# Patient Record
Sex: Female | Born: 1960 | Race: White | Hispanic: No | Marital: Married | Smoking: Never smoker
Health system: Southern US, Community
[De-identification: ages and names within clinical notes are randomized; demographics above are authoritative.]

## PROBLEM LIST (undated history)

## (undated) DIAGNOSIS — R0602 Shortness of breath: Secondary | ICD-10-CM

## (undated) DIAGNOSIS — R3915 Urgency of urination: Secondary | ICD-10-CM

## (undated) DIAGNOSIS — R569 Unspecified convulsions: Secondary | ICD-10-CM

## (undated) DIAGNOSIS — F410 Panic disorder [episodic paroxysmal anxiety] without agoraphobia: Secondary | ICD-10-CM

## (undated) DIAGNOSIS — G4700 Insomnia, unspecified: Secondary | ICD-10-CM

## (undated) DIAGNOSIS — E669 Obesity, unspecified: Secondary | ICD-10-CM

## (undated) DIAGNOSIS — F419 Anxiety disorder, unspecified: Secondary | ICD-10-CM

## (undated) DIAGNOSIS — Z9889 Other specified postprocedural states: Secondary | ICD-10-CM

## (undated) DIAGNOSIS — J45909 Unspecified asthma, uncomplicated: Secondary | ICD-10-CM

## (undated) DIAGNOSIS — I10 Essential (primary) hypertension: Secondary | ICD-10-CM

## (undated) DIAGNOSIS — S060XAA Concussion with loss of consciousness status unknown, initial encounter: Secondary | ICD-10-CM

## (undated) DIAGNOSIS — S060X9A Concussion with loss of consciousness of unspecified duration, initial encounter: Secondary | ICD-10-CM

## (undated) DIAGNOSIS — K219 Gastro-esophageal reflux disease without esophagitis: Secondary | ICD-10-CM

## (undated) DIAGNOSIS — L309 Dermatitis, unspecified: Secondary | ICD-10-CM

## (undated) DIAGNOSIS — D649 Anemia, unspecified: Secondary | ICD-10-CM

## (undated) DIAGNOSIS — Z87442 Personal history of urinary calculi: Secondary | ICD-10-CM

## (undated) DIAGNOSIS — R112 Nausea with vomiting, unspecified: Secondary | ICD-10-CM

## (undated) DIAGNOSIS — R350 Frequency of micturition: Secondary | ICD-10-CM

## (undated) DIAGNOSIS — R351 Nocturia: Secondary | ICD-10-CM

## (undated) DIAGNOSIS — M2550 Pain in unspecified joint: Secondary | ICD-10-CM

## (undated) HISTORY — PX: BREAST ENHANCEMENT SURGERY: SHX7

## (undated) HISTORY — PX: TUBAL LIGATION: SHX77

## (undated) HISTORY — PX: TOTAL ABDOMINAL HYSTERECTOMY W/ BILATERAL SALPINGOOPHORECTOMY: SHX83

## (undated) HISTORY — PX: OTHER SURGICAL HISTORY: SHX169

## (undated) HISTORY — DX: Anxiety disorder, unspecified: F41.9

## (undated) HISTORY — PX: ABDOMINAL HYSTERECTOMY: SHX81

## (undated) HISTORY — DX: Obesity, unspecified: E66.9

## (undated) HISTORY — PX: ORIF ANKLE FRACTURE: SUR919

---

## 2003-12-22 ENCOUNTER — Encounter: Admission: RE | Admit: 2003-12-22 | Discharge: 2004-03-21 | Payer: Self-pay | Admitting: Family Medicine

## 2004-07-07 ENCOUNTER — Emergency Department (HOSPITAL_COMMUNITY): Admission: EM | Admit: 2004-07-07 | Discharge: 2004-07-07 | Payer: Self-pay | Admitting: Emergency Medicine

## 2004-10-03 ENCOUNTER — Ambulatory Visit (HOSPITAL_BASED_OUTPATIENT_CLINIC_OR_DEPARTMENT_OTHER): Admission: RE | Admit: 2004-10-03 | Discharge: 2004-10-03 | Payer: Self-pay | Admitting: Family Medicine

## 2004-10-08 ENCOUNTER — Ambulatory Visit: Payer: Self-pay | Admitting: Internal Medicine

## 2008-12-15 ENCOUNTER — Emergency Department (HOSPITAL_COMMUNITY): Admission: EM | Admit: 2008-12-15 | Discharge: 2008-12-15 | Payer: Self-pay | Admitting: Emergency Medicine

## 2010-04-20 ENCOUNTER — Emergency Department (HOSPITAL_COMMUNITY)
Admission: EM | Admit: 2010-04-20 | Discharge: 2010-04-20 | Payer: Self-pay | Source: Home / Self Care | Admitting: Emergency Medicine

## 2010-07-25 LAB — DIFFERENTIAL
Basophils Absolute: 0.1 10*3/uL (ref 0.0–0.1)
Basophils Relative: 1 % (ref 0–1)
Eosinophils Absolute: 0.2 10*3/uL (ref 0.0–0.7)
Eosinophils Relative: 3 % (ref 0–5)
Monocytes Absolute: 0.4 10*3/uL (ref 0.1–1.0)
Neutro Abs: 5 10*3/uL (ref 1.7–7.7)

## 2010-07-25 LAB — COMPREHENSIVE METABOLIC PANEL
AST: 25 U/L (ref 0–37)
Albumin: 4 g/dL (ref 3.5–5.2)
BUN: 7 mg/dL (ref 6–23)
Calcium: 9.1 mg/dL (ref 8.4–10.5)
Creatinine, Ser: 0.73 mg/dL (ref 0.4–1.2)
GFR calc Af Amer: >60 mL/min (ref >60)

## 2010-07-25 LAB — CBC
HCT: 42.8 % (ref 36.0–46.0)
MCH: 27.6 pg (ref 26.0–34.0)
MCHC: 33.2 g/dL (ref 30.0–36.0)
MCV: 83.1 fL (ref 78.0–100.0)
RDW: 13.5 % (ref 11.5–15.5)

## 2010-07-25 LAB — POCT I-STAT, CHEM 8
Calcium, Ion: 1.05 mmol/L — ABNORMAL LOW (ref 1.12–1.32)
Glucose, Bld: 96 mg/dL (ref 70–99)
HCT: 46 % (ref 36.0–46.0)
Hemoglobin: 15.6 g/dL — ABNORMAL HIGH (ref 12.0–15.0)
TCO2: 25 mmol/L (ref 0–100)

## 2010-07-25 LAB — POCT CARDIAC MARKERS
CKMB, poc: <1 ng/mL — ABNORMAL LOW (ref 1.0–8.0)
Myoglobin, poc: 45.8 ng/mL (ref 12–200)

## 2010-08-19 LAB — RAPID URINE DRUG SCREEN, HOSP PERFORMED
Amphetamines: NOT DETECTED
Barbiturates: NOT DETECTED
Opiates: NOT DETECTED
Tetrahydrocannabinol: NOT DETECTED

## 2010-09-29 NOTE — Procedures (Signed)
NAME:  [name] [name], [name] [name]                 ACCOUNT NO.:  [id number]   MEDICAL RECORD NO.:  [id number]          PATIENT TYPE:  OUT   LOCATION:  SLEEP CENTER                 FACILITY:  [facility] - Closed   PHYSICIAN:  Clinton D. Maple Hudson, M.D. DATE OF BIRTH:  Jun 01, 1960   DATE OF STUDY:  10/03/2004                              NOCTURNAL POLYSOMNOGRAM   REFERRING PHYSICIAN:  Cheri Rous, M.D.   INDICATIONS FOR STUDY:  Hypersomnia with sleep apnea.   EPWORTH SLEEPINESS SCORE:  6/24, BMI 29, weight 165 pounds.   SLEEP ARCHITECTURE:  Total sleep time 446 minutes with sleep efficiency 92%.  Stage 1 was 7%; stage 2 was 82%; stages 3 and 4 were 3%, and REM was 7% of  total sleep time.  Sleep latency 5.5 minutes, REM latency 246 minutes.  Awake after sleep onset 34 minutes.  Arousal index increased at 77.  Klonopin and Ambien were listed, but apparently only Ativan 10 mg was taken  at bedtime.   RESPIRATORY DATA:  Respiratory disturbance index (RDI, AHI) 6.5 obstructive  events per hour indicating mild obstructive sleep apnea/hypopnea syndrome.  There were 2 central apneas, 38 obstructive apneas, and 8 hypopneas.  Events  were not position.  REM RDI 5.8.  She did not have enough events to qualify  for split study protocol on the study night.  There were many more EEG  arousals than were explained by respiratory events, most were spontaneous.   OXYGEN DATA:  Light to moderate snoring with oxygen desaturation to a nadir  of 76% with events.  Mean oxygen saturation through the study was 96% on  room air.   CARDIAC DATA:  Normal sinus rhythm.   MOVEMENT/PARASOMNIA:  A total of 48 limb jerks were recorded of which 13  were associated with arousal or awakening for a periodic limb movement with  arousal index of 1.7 per hour which is of doubtful significance.  Bathroom  trips x 5 were reported.  To gave history of bruxism.   IMPRESSION/RECOMMENDATION:  1.  Mild obstructive sleep apnea/hypopnea syndrome, RDI 6.5  per hour with      moderate snoring and oxygen desaturation to 76%.  2.  This is below the frequency of events usually considered for CPAP      therapy except in special circumstances.  Consider for alternative      therapies including weight loss and evaluation for nasopharyngeal      obstruction, allergic rhinitis, etc.  It may help to sleep off flat of      back even though most of these events were not clearly positional.  3.  Frequent bathroom trips contributed to sleep disruption.  4.  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      CDY/MEDQ  D:  10/08/2004 15:32:18  T:  10/08/2004 17:50:30  Job:  [id number]   cc:   Cheri Rous, M.D.

## 2011-05-15 HISTORY — PX: CARDIAC CATHETERIZATION: SHX172

## 2012-03-06 ENCOUNTER — Emergency Department (HOSPITAL_COMMUNITY): Payer: BC Managed Care – PPO

## 2012-03-06 ENCOUNTER — Encounter (HOSPITAL_COMMUNITY): Payer: Self-pay | Admitting: Emergency Medicine

## 2012-03-06 ENCOUNTER — Inpatient Hospital Stay (HOSPITAL_COMMUNITY)
Admission: EM | Admit: 2012-03-06 | Discharge: 2012-03-10 | DRG: 125 | Disposition: A | Discharge status: Home / Self Care | Payer: BC Managed Care – PPO | Attending: Internal Medicine | Admitting: Internal Medicine

## 2012-03-06 DIAGNOSIS — R071 Chest pain on breathing: Secondary | ICD-10-CM | POA: Diagnosis present

## 2012-03-06 DIAGNOSIS — Z8249 Family history of ischemic heart disease and other diseases of the circulatory system: Secondary | ICD-10-CM

## 2012-03-06 DIAGNOSIS — R072 Precordial pain: Principal | ICD-10-CM | POA: Diagnosis present

## 2012-03-06 DIAGNOSIS — R911 Solitary pulmonary nodule: Secondary | ICD-10-CM | POA: Diagnosis present

## 2012-03-06 DIAGNOSIS — Z79899 Other long term (current) drug therapy: Secondary | ICD-10-CM

## 2012-03-06 DIAGNOSIS — I517 Cardiomegaly: Secondary | ICD-10-CM | POA: Diagnosis present

## 2012-03-06 DIAGNOSIS — J45909 Unspecified asthma, uncomplicated: Secondary | ICD-10-CM | POA: Diagnosis present

## 2012-03-06 DIAGNOSIS — I200 Unstable angina: Secondary | ICD-10-CM

## 2012-03-06 DIAGNOSIS — G40909 Epilepsy, unspecified, not intractable, without status epilepticus: Secondary | ICD-10-CM | POA: Diagnosis present

## 2012-03-06 DIAGNOSIS — I10 Essential (primary) hypertension: Secondary | ICD-10-CM | POA: Diagnosis present

## 2012-03-06 HISTORY — DX: Essential (primary) hypertension: I10

## 2012-03-06 HISTORY — DX: Unspecified convulsions: R56.9

## 2012-03-06 HISTORY — DX: Unspecified asthma, uncomplicated: J45.909

## 2012-03-06 LAB — BASIC METABOLIC PANEL
BUN: 18 mg/dL (ref 6–23)
CO2: 27 mEq/L (ref 19–32)
GFR calc non Af Amer: >90 mL/min (ref >90)
Glucose, Bld: 113 mg/dL — ABNORMAL HIGH (ref 70–99)
Potassium: 3.7 mEq/L (ref 3.5–5.1)
Sodium: 140 mEq/L (ref 135–145)

## 2012-03-06 LAB — POCT I-STAT TROPONIN I: Troponin i, poc: 0 ng/mL (ref 0.00–0.08)

## 2012-03-06 LAB — D-DIMER, QUANTITATIVE: D-Dimer, Quant: 0.51 ug/mL-FEU — ABNORMAL HIGH (ref 0.00–0.48)

## 2012-03-06 LAB — CBC
HCT: 40.7 % (ref 36.0–46.0)
Hemoglobin: 13.9 g/dL (ref 12.0–15.0)
MCH: 28.3 pg (ref 26.0–34.0)
MCHC: 34.2 g/dL (ref 30.0–36.0)
RBC: 4.92 MIL/uL (ref 3.87–5.11)

## 2012-03-06 LAB — HEPATIC FUNCTION PANEL
ALT: 15 U/L (ref 0–35)
Albumin: 3.9 g/dL (ref 3.5–5.2)
Alkaline Phosphatase: 78 U/L (ref 39–117)
Total Bilirubin: 0.8 mg/dL (ref 0.3–1.2)
Total Protein: 7 g/dL (ref 6.0–8.3)

## 2012-03-06 MED ORDER — GI COCKTAIL ~~LOC~~: 30.0000 mL | Freq: Once | ORAL | Status: DC

## 2012-03-06 MED ORDER — NAPROXEN 500 MG PO TABS
500.0000 mg | ORAL_TABLET | Freq: Two times a day (BID) | ORAL | Status: DC
  Administered 2012-03-07 – 2012-03-09 (×5): 500 mg via ORAL
  Filled 2012-03-06 (×10): qty 1

## 2012-03-06 MED ORDER — DIPHENHYDRAMINE HCL 25 MG PO TABS
25.0000 mg | ORAL_TABLET | Freq: Four times a day (QID) | ORAL | Status: DC | PRN
  Administered 2012-03-06 – 2012-03-09 (×4): 25 mg via ORAL
  Filled 2012-03-06 (×4): qty 1

## 2012-03-06 MED ORDER — TRAMADOL HCL 50 MG PO TABS: 50.0000 mg | ORAL_TABLET | Freq: Four times a day (QID) | ORAL | Status: DC | PRN

## 2012-03-06 MED ORDER — KETOROLAC TROMETHAMINE 30 MG/ML IJ SOLN
30.0000 mg | Freq: Once | INTRAMUSCULAR | Status: AC
  Administered 2012-03-06: 30 mg via INTRAVENOUS
  Filled 2012-03-06: qty 1

## 2012-03-06 MED ORDER — ALBUTEROL SULFATE HFA 108 (90 BASE) MCG/ACT IN AERS
2.0000 | INHALATION_SPRAY | Freq: Four times a day (QID) | RESPIRATORY_TRACT | Status: DC | PRN
  Filled 2012-03-06: qty 6.7

## 2012-03-06 MED ORDER — ALPRAZOLAM 0.5 MG PO TABS
0.5000 mg | ORAL_TABLET | Freq: Three times a day (TID) | ORAL | Status: DC | PRN
  Administered 2012-03-06 – 2012-03-09 (×5): 0.5 mg via ORAL
  Filled 2012-03-06 (×5): qty 1

## 2012-03-06 MED ORDER — ATENOLOL 50 MG PO TABS
50.0000 mg | ORAL_TABLET | Freq: Every day | ORAL | Status: DC
  Filled 2012-03-06 (×2): qty 1

## 2012-03-06 MED ORDER — AMLODIPINE BESYLATE 10 MG PO TABS
10.0000 mg | ORAL_TABLET | Freq: Every day | ORAL | Status: DC
  Administered 2012-03-08 – 2012-03-10 (×3): 10 mg via ORAL
  Filled 2012-03-06 (×4): qty 1

## 2012-03-06 MED ORDER — PANTOPRAZOLE SODIUM 40 MG PO TBEC
40.0000 mg | DELAYED_RELEASE_TABLET | Freq: Every day | ORAL | Status: DC
  Administered 2012-03-07 – 2012-03-08 (×2): 40 mg via ORAL
  Filled 2012-03-06 (×2): qty 1

## 2012-03-06 MED ORDER — HEPARIN BOLUS VIA INFUSION: 4000.0000 [IU] | Freq: Once | INTRAVENOUS | Status: DC

## 2012-03-06 MED ORDER — ONDANSETRON HCL 4 MG/2ML IJ SOLN
4.0000 mg | Freq: Four times a day (QID) | INTRAMUSCULAR | Status: DC | PRN
  Administered 2012-03-06 – 2012-03-09 (×4): 4 mg via INTRAVENOUS
  Filled 2012-03-06 (×4): qty 2

## 2012-03-06 MED ORDER — ONDANSETRON HCL 4 MG/2ML IJ SOLN
4.0000 mg | Freq: Once | INTRAMUSCULAR | Status: AC
  Administered 2012-03-06: 4 mg via INTRAVENOUS
  Filled 2012-03-06: qty 2

## 2012-03-06 MED ORDER — ACETAMINOPHEN 500 MG PO TABS
1000.0000 mg | ORAL_TABLET | Freq: Four times a day (QID) | ORAL | Status: DC | PRN
  Administered 2012-03-06: 1000 mg via ORAL
  Filled 2012-03-06 (×2): qty 2

## 2012-03-06 MED ORDER — ALUM & MAG HYDROXIDE-SIMETH 200-200-20 MG/5ML PO SUSP
30.0000 mL | Freq: Once | ORAL | Status: AC
  Administered 2012-03-06: 30 mL via ORAL
  Filled 2012-03-06: qty 30

## 2012-03-06 MED ORDER — HEPARIN (PORCINE) IN NACL 100-0.45 UNIT/ML-% IJ SOLN
850.0000 [IU]/h | INTRAMUSCULAR | Status: DC
  Filled 2012-03-06: qty 250

## 2012-03-06 MED ORDER — ZOLPIDEM TARTRATE 5 MG PO TABS
10.0000 mg | ORAL_TABLET | Freq: Every evening | ORAL | Status: DC | PRN
  Administered 2012-03-06 – 2012-03-09 (×4): 10 mg via ORAL
  Filled 2012-03-06 (×2): qty 1
  Filled 2012-03-06 (×3): qty 2

## 2012-03-06 MED ORDER — ENOXAPARIN SODIUM 40 MG/0.4ML ~~LOC~~ SOLN
40.0000 mg | SUBCUTANEOUS | Status: DC
  Administered 2012-03-06 – 2012-03-09 (×4): 40 mg via SUBCUTANEOUS
  Filled 2012-03-06 (×5): qty 0.4

## 2012-03-06 MED ORDER — NITROGLYCERIN IN D5W 200-5 MCG/ML-% IV SOLN
2.0000 ug/min | INTRAVENOUS | Status: DC
  Administered 2012-03-06: 5 ug/min via INTRAVENOUS
  Filled 2012-03-06: qty 250

## 2012-03-06 NOTE — ED Notes (Signed)
Pt states around 3am she was awakened by a sharp pain in her sternum. Pt took 2 tylenol and laid back down and the pain eased off. She went to work this morning still had a nagging pain and she was sitting and suddenly the pain got worse. She states she gets relief from pressing on the area. Pt state " I feel like I have a broken sternum". N/V x1. 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 Pt was given 100 mcg fentanyl by EMS and reports pain a 8/10.

## 2012-03-06 NOTE — H&P (Signed)
Patient ID: Evalyne Cortopassi MRN: [id number], DOB/AGE: [birth year]   Admit date: 03/06/2012   Primary Physician: No primary provider on file. Primary Cardiologist: Dr Rennis Golden (new)  HPI: 51 y/o female with no cardiac history, presents to the ER today with complaints of SSCP. Her symptoms started 3 am, worse when she sits up. Her symptoms eased and she went on to work. At work her symptoms got worse. In the ER no relief with Mylanta or Toradol. She is still complaining of pain="like a golf ball under my sternum" No radiation to her arms, some worsening with deep breathing. No hemoptysis.   Problem List: Past Medical History  Diagnosis Date  . Hypertension   . Seizures     Past Surgical History  Procedure Date  . Orif ankle fracture 9 months ago    Rt ankle  . Abdominal hysterectomy   . Total abdominal hysterectomy w/ bilateral salpingoophorectomy      Allergies:  Allergies  Allergen Reactions  . Dilantin (Phenytoin Sodium Extended) Other (See Comments)    Reaction unknown  . Keppra (Levetiracetam) Other (See Comments)    Reaction unknown  . Phenobarbital Other (See Comments)    Reaction unknown  . Vimpat (Lacosamide) Other (See Comments)    Reaction unknown     Home Medications  (Not in a hospital admission)   Family History  Problem Relation Age of Onset  . Heart failure Mother   . Heart failure Father      History   Social History  . Marital Status: Married    Spouse Name: N/A    Number of Children: N/A  . Years of Education: N/A   Occupational History  . Not on file.   Social History Main Topics  . Smoking status: Never Smoker   . Smokeless tobacco: Not on file  . Alcohol Use: 0.6 oz/week    1 Glasses of wine per week     Occasionally  . Drug Use: No  . Sexually Active:    Other Topics Concern  . Not on file   Social History Narrative  . No narrative on file     Review of Systems: General: negative for chills, fever, night sweats or weight  changes.  Cardiovascular: negative for chest pain, dyspnea on exertion, edema, orthopnea, palpitations, paroxysmal nocturnal dyspnea or shortness of breath Dermatological: negative for rash Respiratory: negative for cough or wheezing Urologic: negative for hematuria Abdominal: negative for nausea, vomiting, diarrhea, bright red blood per rectum, melena, or hematemesis Neurologic: negative for visual changes, syncope, or dizziness All other systems reviewed and are otherwise negative except as noted above.  Physical Exam: Blood pressure 118/74, pulse 59, temperature 97.9 F (36.6 C), temperature source Oral, resp. rate 22, height 5\' 3"  (1.6 m), weight 84.823 kg (187 lb), SpO2 100.00%.  General appearance: alert, cooperative and mild distress Neck: no carotid bruit and no JVD Lungs: clear to auscultation bilaterally Heart: regular rate and rhythm Abdomen: soft, non-tender; bowel sounds normal; no masses,  no organomegaly Extremities: extremities normal, atraumatic, no cyanosis or edema Pulses: 2+ and symmetric Skin: Skin color, texture, turgor normal. No rashes or lesions Neurologic: Grossly normal    Labs:   Results for orders placed during the hospital encounter of 03/06/12 (from the past 24 hour(s))  CBC     Status: Normal   Collection Time   03/06/12  3:10 PM      Component Value Range   WBC 7.8  4.0 - 10.5 K/uL   RBC 4.92  3.87 - 5.11 MIL/uL   Hemoglobin 13.9  12.0 - 15.0 g/dL   HCT 16.1  09.6 - 04.5 %   MCV 82.7  78.0 - 100.0 fL   MCH 28.3  26.0 - 34.0 pg   MCHC 34.2  30.0 - 36.0 g/dL   RDW 40.9  81.1 - 91.4 %   Platelets 270  150 - 400 K/uL  BASIC METABOLIC PANEL     Status: Abnormal   Collection Time   03/06/12  3:10 PM      Component Value Range   Sodium 140  135 - 145 mEq/L   Potassium 3.7  3.5 - 5.1 mEq/L   Chloride 104  96 - 112 mEq/L   CO2 27  19 - 32 mEq/L   Glucose, Bld 113 (*) 70 - 99 mg/dL   BUN 18  6 - 23 mg/dL   Creatinine, Ser 7.82  0.50 - 1.10  mg/dL   Calcium 9.5  8.4 - 95.6 mg/dL   GFR calc non Af Amer >90  >90 mL/min   GFR calc Af Amer >90  >90 mL/min  POCT I-STAT TROPONIN I     Status: Normal   Collection Time   03/06/12  3:18 PM      Component Value Range   Troponin i, poc 0.00  0.00 - 0.08 ng/mL   Comment 3           LIPASE, BLOOD     Status: Abnormal   Collection Time   03/06/12  3:25 PM      Component Value Range   Lipase 79 (*) 11 - 59 U/L  HEPATIC FUNCTION PANEL     Status: Normal   Collection Time   03/06/12  3:25 PM      Component Value Range   Total Protein 7.0  6.0 - 8.3 g/dL   Albumin 3.9  3.5 - 5.2 g/dL   AST 14  0 - 37 U/L   ALT 15  0 - 35 U/L   Alkaline Phosphatase 78  39 - 117 U/L   Total Bilirubin 0.8  0.3 - 1.2 mg/dL   Bilirubin, Direct 0.1  0.0 - 0.3 mg/dL   Indirect Bilirubin 0.7  0.3 - 0.9 mg/dL     Radiology/Studies: Dg Chest Port 1 View  03/06/2012  *RADIOLOGY REPORT*  Clinical Data: Chest pain  PORTABLE CHEST - 1 VIEW  Comparison: 05/30/2010  Findings: Mild cardiomegaly.  Mild subsegmental bibasilar atelectasis.  No pneumothorax and no pleural effusion. Normal vascularity.  IMPRESSION: Bibasilar atelectasis.  Cardiomegaly.   Original Report Authenticated By: Donavan Burnet, M.D.     EKG:NSR SB without acute changes  ASSESSMENT AND PLAN:  Principal Problem:  *Chest pain on breathing Active Problems:  HTN (hypertension)  Family history of coronary artery disease  Plan- R/O MI, add PPI, NSAID, NTG. Check d-dimer, she has had some chronic Rt leg edema after her surgery 9 months ago.  Deland Pretty, PA-C 03/06/2012, 5:14 PM

## 2012-03-06 NOTE — H&P (Signed)
Pt. Seen and examined. Agree with the NP/PA-C note as written.  51 yo female with a history of chest pain about 2 years ago which was similar. She ruled-out for MI at that time in [place]. 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 It does not radiate and is not relieved by rest or provoked with activity. She received 2 nitroglycerins in the ER which helped the pain as well as a GI cocktail and toradol which were ineffective. EKG shows NSR without ischemia. POC troponin is negative. CXR does show cardiomegaly, but otherwise negative. Will r/o ACS overnight. Start nitroglycerin for possible ACS, however, I suspect this may be esophageal spasm.  Would be reasonable to obtain an echocardiogram in the morning given her cardiomegaly to r/o pericardial effusion. Her pain is worse while sitting up and a little better lying down, which is opposite of what I would expect. Will also check d-dimer for right swollen leg, although she reports this is chronic since her recent ORIF.  Chrystie Nose, MD, [facility] Attending Cardiologist [facility]

## 2012-03-06 NOTE — ED Provider Notes (Addendum)
I have supervised the resident on the management of this patient and agree with the note above. I personally interviewed and examined the patient and my addendum is below.   [name] [name] is a 51 y.o. female hx of HTN here with chest pain. Sudden onset CP last night and another episode today. Improved slightly with nitro. No SOB. On my exam, patient was clenching her chest. EKG nl, Trop neg x 1. No CAD in the past. Patient's pain not improved with GI cocktail and toradol. She was started on nitro drip and heparin drip for possible unstable angina. Patient admitted to cardiology under stepdown given active chest pain.   CRITICAL CARE Performed by: Silverio Lay, Taran Haynesworth   Total critical care time: 30 minutes  Critical care time was exclusive of separately billable procedures and treating other patients.  Critical care was necessary to treat or prevent imminent or life-threatening deterioration.  Critical care was time spent personally by me on the following activities: development of treatment plan with patient and/or surrogate as well as nursing, discussions with consultants, evaluation of patient's response to treatment, examination of patient, obtaining history from patient or surrogate, ordering and performing treatments and interventions, ordering and review of laboratory studies, ordering and review of radiographic studies, pulse oximetry and re-evaluation of patient's condition.   Richardean Canal, MD 03/06/12 1651  Richardean Canal, MD 03/07/12 [phone]

## 2012-03-06 NOTE — ED Provider Notes (Signed)
History     CSN: [id number]  Arrival date & time 03/06/12  1348   None     Chief Complaint  Patient presents with  . Chest Pain    (Consider location/radiation/quality/duration/timing/severity/associated sxs/prior treatment) Patient is a 51 y.o. female presenting with chest pain.  Chest Pain   Chest pain.  Pain described as sharp, 7/10 in intensity. The location of the patient's problem is central chest with no radiation.  Onset was sudden at 3 AM with waxing and waning course since that time. 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  Associated symptoms: worse with breathing, worse with movement. Emesis x1.   Past Medical History  Diagnosis Date  . Hypertension   . Seizures     Past Surgical History  Procedure Date  . Orif ankle fracture     Rt ankle  . Abdominal hysterectomy   . Total abdominal hysterectomy w/ bilateral salpingoophorectomy     Family History  Problem Relation Age of Onset  . Heart failure Mother   . Heart failure Father     History  Substance Use Topics  . Smoking status: Never Smoker   . Smokeless tobacco: Not on file  . Alcohol Use: 0.6 oz/week    1 Glasses of wine per week     Occasionally    OB History    Grav Para Term Preterm Abortions TAB SAB Ect Mult Living                  Review of Systems  Cardiovascular: Positive for chest pain.  Negative for respiratory distress, cough. Positive for vomiting, negative for diarrhea. LMP 3 years ago prior to hysterectomy.  No change in stool color, rectal bleeding.   All other systems reviewed and negative unless noted in HPI.     Allergies  Dilantin; Keppra; Phenobarbital; and Vimpat  Home Medications   Current Outpatient Rx  Name Route Sig Dispense Refill  . ACETAMINOPHEN 500 MG PO TABS Oral Take 1,000 mg by mouth every 6  (six) hours as needed. For pain    . ALBUTEROL SULFATE HFA 108 (90 BASE) MCG/ACT IN AERS Inhalation Inhale 2 puffs into the lungs every 6 (six) hours as needed. For shortness of breath    . ALPRAZOLAM 0.5 MG PO TABS Oral Take 0.5 mg by mouth 3 (three) times daily as needed. For anxiety    . AMLODIPINE BESYLATE 10 MG PO TABS Oral Take 10 mg by mouth daily.    . ATENOLOL 50 MG PO TABS Oral Take 50 mg by mouth daily.    [name] DIPHENHYDRAMINE HCL 25 MG PO TABS Oral Take 25 mg by mouth every 6 (six) hours as needed. For sleep    . KRILL OIL PO Oral Take 1 capsule by mouth daily.      BP 118/74  Pulse 59  Temp 97.9 F (36.6 C) (Oral)  Resp 22  SpO2 100%  Physical Exam Nursing note and vitals reviewed.  Constitutional: Pt is alert and appears stated age. Oropharynx: Airway open without erythema or exudate. Respiratory: No respiratory distress. Equal breathing bilaterally. CV: Extremities warm and well perfused. Neuro: No motor nor sensory deficit. Head: Normocephalic and atraumatic. Eyes: No conjunctivitis, no scleral icterus. Neck: Supple, no mass. Chest: Tender to palpation. Abdomen: Soft, non-tender MSK: Extremities are atraumatic without deformity.  Skin: No rash, no wounds.  ED Course  Procedures (including critical care time)  Labs Reviewed  BASIC METABOLIC PANEL - Abnormal; Notable for the following:    Glucose, Bld 113 (*)     All other components within normal limits  LIPASE, BLOOD - Abnormal; Notable for the following:    Lipase 79 (*)     All other components within normal limits  CBC  HEPATIC FUNCTION PANEL  POCT I-STAT TROPONIN I   Dg Chest Port 1 View  03/06/2012  *RADIOLOGY REPORT*  Clinical Data: Chest pain  PORTABLE CHEST - 1 VIEW  Comparison: 05/30/2010  Findings: Mild cardiomegaly.  Mild subsegmental bibasilar atelectasis.  No pneumothorax and no pleural effusion. Normal vascularity.  IMPRESSION: Bibasilar atelectasis.  Cardiomegaly.   Original Report  Authenticated By: Donavan Burnet, M.D.      1. Unstable angina       MDM  51 y.o. female here with chest pain.  Pertinent past problems include HTN, seizures. ACS risk factors of HTN. PERC neg. Afebrile. Doubt PNA. Doubt PTX.    Medications/interventions:  ASA, nitro, fentanyl by EMS. Maalox, toradol. Nitro gtt, heparin gtt  Data reviewed: EKG ordered and interpreted by me: NSR, no axis deviation, no QRS widening, no ST segment changes and no prior EKG available.  Lab tests ordered and reviewed by me: CBC, CMP unremarkable. Lipase elevated at 79. POCT troponin low. I independently viewed the following imaging studies and reviewed radiology's interpretation as summarized: CXR with bibasilar atelectasis. Cardiomegaly.  Course of care: On re-eval, pt remains with 7/10 chest pain. Nausea imporved. Only relief came from nitroglycerin by EMS. No relief from narcotic, maalox, or toradol. Will proceed with starting nitro gtt along with heparin gtt for presumed unstable angina. Cards consulted. They will admit.   Medical Decision Making discussed with ED attending Richardean Canal, MD          Charm Barges, MD 03/06/12 1640

## 2012-03-06 NOTE — Progress Notes (Signed)
ANTICOAGULATION CONSULT NOTE - Initial Consult  Pharmacy Consult for Heparin Indication: chest pain/ACS  Allergies  Allergen Reactions  . Dilantin (Phenytoin Sodium Extended) Other (See Comments)    Reaction unknown  . Keppra (Levetiracetam) Other (See Comments)    Reaction unknown  . Phenobarbital Other (See Comments)    Reaction unknown  . Vimpat (Lacosamide) Other (See Comments)    Reaction unknown    Patient Measurements: Height: 5\' 3"  (160 cm) Weight: 187 lb (84.823 kg) IBW/kg (Calculated) : 52.4  Heparin Dosing Weight: 71 kg  Vital Signs: Temp: 97.9 F (36.6 C) (10/24 1354) Temp src: Oral (10/24 1354) BP: 118/74 mmHg (10/24 1354) Pulse Rate: 59  (10/24 1354)  Labs:  Basename 03/06/12 1510  HGB 13.9  HCT 40.7  PLT 270  APTT --  LABPROT --  INR --  HEPARINUNFRC --  CREATININE 0.70  CKTOTAL --  CKMB --  TROPONINI --    Estimated Creatinine Clearance: 87.8 ml/min (by C-G formula based on Cr of 0.7).   Medical History: Past Medical History  Diagnosis Date  . Hypertension   . Seizures     Assessment: 51 y.o. F who presented to the [facility] with CP. First set of cardiac enzymes are negative. Pharmacy has been consulted to start heparin while awaiting further cardiac evaluation.  The patient's states her last surgery was on her ankle ~9 months ago, she has no recent bleeding or hx CVA. The patient was not taking any blood thinners PTA. Hep wt~71 kg, baseline Hgb/Hct/Plt wnl.   Goal of Therapy:  Heparin level 0.3-0.7 units/ml Monitor platelets by anticoagulation protocol: Yes   Plan:  1. Heparin bolus of 4000 units x 1 2. Initiate heparin drip at rate of 850 units/hr (8.5 ml/hr) 3. Daily heparin levels, CBC 4. Will continue to monitor for any signs/symptoms of bleeding and will follow up with heparin level in 6 hours   Georgina Pillion, PharmD, BCPS Clinical Pharmacist Pager: [phone] 03/06/2012 5:02 PM

## 2012-03-06 NOTE — ED Notes (Signed)
Dr Hilty at the bedside 

## 2012-03-07 ENCOUNTER — Observation Stay (HOSPITAL_COMMUNITY): Payer: BC Managed Care – PPO

## 2012-03-07 ENCOUNTER — Encounter (HOSPITAL_COMMUNITY): Payer: Self-pay | Admitting: Cardiology

## 2012-03-07 DIAGNOSIS — R072 Precordial pain: Principal | ICD-10-CM

## 2012-03-07 DIAGNOSIS — R222 Localized swelling, mass and lump, trunk: Secondary | ICD-10-CM

## 2012-03-07 DIAGNOSIS — J45909 Unspecified asthma, uncomplicated: Secondary | ICD-10-CM | POA: Diagnosis present

## 2012-03-07 LAB — TROPONIN I: Troponin I: <0.3 ng/mL (ref <0.30)

## 2012-03-07 MED ORDER — REGADENOSON 0.4 MG/5ML IV SOLN
0.4000 mg | Freq: Once | INTRAVENOUS | Status: AC
  Administered 2012-03-08: 0.4 mg via INTRAVENOUS
  Filled 2012-03-07: qty 5

## 2012-03-07 MED ORDER — IOHEXOL 350 MG/ML SOLN
100.0000 mL | Freq: Once | INTRAVENOUS | Status: AC | PRN
  Administered 2012-03-07: 100 mL via INTRAVENOUS

## 2012-03-07 NOTE — Consult Note (Signed)
Name: [name] [name] MRN: [id number] DOB: 07-30-1960    LOS: 1  PULMONARY / CRITICAL CARE MEDICINE  HPI:   51 years old female with PMH relevant for HTN, asthma and seizures. Admitted to the Cardiology service with substernal chest pain to rule out ACS. CTA of the chest ruled out PE but showed interval increase in size of LUL mass when compared to a CTA done in 07/29/2008. At the time of my exam the patient is asymptomatic. She does complain of chronic intermittent non productive cough that she relates to her asthma. Denies hemoptysis except for one episode of bloody streaks yesterday after a coughing spell. Also denies weight loss, SOB, fever. The patient is a lifetime non smoker but was a second hand smoker during her childhood since everybody at home used to smoke. Her father had lung cancer.  Past Medical History  Diagnosis Date  . Hypertension   . Seizures   . Asthma 03/07/2012   Past Surgical History  Procedure Date  . Orif ankle fracture 9 months ago    Rt ankle  . Abdominal hysterectomy   . Total abdominal hysterectomy w/ bilateral salpingoophorectomy    Prior to Admission medications   Medication Sig Start Date End Date Taking? Authorizing Provider  acetaminophen (TYLENOL) 500 MG tablet Take 1,000 mg by mouth every 6 (six) hours as needed. For pain   Yes Historical Provider, MD  albuterol (PROVENTIL HFA;VENTOLIN HFA) 108 (90 BASE) MCG/ACT inhaler Inhale 2 puffs into the lungs every 6 (six) hours as needed. For shortness of breath   Yes Historical Provider, MD  ALPRAZolam (XANAX) 0.5 MG tablet Take 0.5 mg by mouth 3 (three) times daily as needed. For anxiety   Yes Historical Provider, MD  amLODipine (NORVASC) 10 MG tablet Take 10 mg by mouth daily.   Yes Historical Provider, MD  atenolol (TENORMIN) 50 MG tablet Take 50 mg by mouth daily.   Yes Historical Provider, MD  diphenhydrAMINE (BENADRYL) 25 MG tablet Take 25 mg by mouth every 6 (six) hours as needed. For sleep   Yes  Historical Provider, MD  KRILL OIL PO Take 1 capsule by mouth daily.   Yes Historical Provider, MD   Allergies Allergies  Allergen Reactions  . Dilantin (Phenytoin Sodium Extended) Other (See Comments)    Reaction unknown  . Keppra (Levetiracetam) Other (See Comments)    Reaction unknown  . Phenobarbital Other (See Comments)    Reaction unknown  . Vimpat (Lacosamide) Other (See Comments)    Reaction unknown    Family History Family History  Problem Relation Age of Onset  . Heart failure Mother   . Heart failure Father   . Heart failure Brother    Social History  reports that she has never smoked. She does not have any smokeless tobacco history on file. She reports that she drinks about .6 ounces of alcohol per week. She reports that she does not use illicit drugs.  Review Of Systems:  All systems reviewed except for what I mentioned in the HPI.    Vital Signs: Temp:  [97.7 F (36.5 C)-98.3 F (36.8 C)] 98.1 F (36.7 C) (10/25 1400) Pulse Rate:  [58-74] 74  (10/25 1400) Resp:  [18] 18  (10/25 1400) BP: (95-122)/(55-80) 122/80 mmHg (10/25 1400) SpO2:  [97 %-99 %] 99 % (10/25 1400) Weight:  [184 lb (83.462 kg)] 184 lb (83.462 kg) (10/24 2051)  Physical Examination: General:  Pleasant female patient in no acute distress Neuro:  Awake, alert, oriented x  3, nonfocal HEENT:  PERRL, pink conjunctivae, moist membranes Neck:  Supple, no JVD   Cardiovascular:  RRR, no M/R/G Lungs:  CTA Abdomen:  Soft, nontender, nondistended, bowel sounds present Musculoskeletal:  Moves all extremities, no pedal edema Skin:  No rash Lymph nodes: No cervical supraclavicular, axillary or inguinal adenopathy.  Laboratory data:  Lab 03/07/12 0607 TROPONINI <0.30 LATICACIDVEN -- PROBNP --   Lab 03/06/12 1510 NA 140 K 3.7 CL 104 CO2 27 BUN 18 CREATININE 0.70 CALCIUM 9.5 MG -- PHOS --   Lab 03/06/12  1525 AST 14 ALT 15 ALKPHOS 78 BILITOT 0.8 PROT 7.0 ALBUMIN 3.9    Lab 03/06/12 1510 HGB 13.9 HCT 40.7 PLT 270 INR -- APTT --   Lab 03/06/12 1510 WBC 7.8 PROCALCITON --  CTA of the chest done today: Findings: No pulmonary embolus is identified. Bilateral breast  implants are noted. Heart size is mildly enlarged. No pleural  effusion is identified.  A lobulated nodule measuring 1.8 x 2.2 x 2.0 cm is identified in  the left upper lobe suspicious for bronchogenic carcinoma. In  retrospect, this nodule is present on the comparison study where it  measured 1.8 x 1.3 x 1.6 cm. Also seen is some dependent  atelectasis. The lung bases are not included on the study.  Visualized liver parenchyma is unremarkable.  IMPRESSION:  1. Negative for pulmonary embolus.  2. Left upper lobe pulmonary nodule is slowly increasing in size  since 2010 and is worrisome for bronchogenic carcinoma.  Critical Value/emergent results were called by telephone at the  time of interpretation on 03/07/2012 at 1:40 p.m. to Hetty Ely,  who verbally acknowledged these results.   Principal Problem:  *Chest pain on breathing, negative MI. Active Problems:  HTN (hypertension)  Family history of coronary artery disease  Asthma   ASSESSMENT AND PLAN 51 years old female with PMH relevant for HTN, asthma and seizures. Admitted to the Cardiology service with substernal chest pain to rule out ACS. CTA of the chest ruled out PE but showed interval increase in size of LUL lingular mass when compared to a CTA done in 07/29/2008 These findings are concerning for a primary lung cancer. Unclear to me if there is endobronchial lesion and will need to discuss with radiology. No evident mediastinal adenopathy on CT. The patient has a chronic dry cough that she attributes to her asthma. She was a second hand smoker and her father had lung cancer.   The patient will need clinical staging and tissue diagnosis. Will start  with PET CT (skull to thigh) followed possibly by bronchoscopy with endobronchial ultrasound and TBNA vs surgical approach depending on PET CT findings. These workup can be done as an outpatient soon after discharge once ACS is ruled out.   Overton Mam, M.D. Pulmonary and Critical Care Medicine [facility] Pager: [phone]  03/07/2012, 8:17 PM

## 2012-03-07 NOTE — Progress Notes (Signed)
CT ANGIOGRAPHY CHEST  Technique: Multidetector CT imaging of the chest using the  standard protocol during bolus administration of intravenous  contrast. Multiplanar reconstructed images including MIPs were  obtained and reviewed to evaluate the vascular anatomy.  Contrast: OMNIPAQUE IOHEXOL 350 MG/ML SOLN  Comparison: CT chest 07/29/2008. Plain film of the chest  03/06/2012 and PA and lateral chest 05/30/2010.  Findings: No pulmonary embolus is identified. Bilateral breast  implants are noted. Heart size is mildly enlarged. No pleural  effusion is identified.  A lobulated nodule measuring 1.8 x 2.2 x 2.0 cm is identified in  the left upper lobe suspicious for bronchogenic carcinoma. In  retrospect, this nodule is present on the comparison study where it  measured 1.8 x 1.3 x 1.6 cm. Also seen is some dependent  atelectasis. The lung bases are not included on the study.  Visualized liver parenchyma is unremarkable.  IMPRESSION:  1. Negative for pulmonary embolus.  2. Left upper lobe pulmonary nodule is slowly increasing in size  since 2010 and is worrisome for bronchogenic carcinoma.  CT angio completed today is negative for PE but pulmonary nodule increasing in size since 2010(See above).  Needs pulmonary FU    HAGER, BRYAN 1:54 PM

## 2012-03-07 NOTE — Progress Notes (Signed)
Subjective:   Objective: Vital signs in last 24 hours: Temp:  [97.7 F (36.5 C)-98.3 F (36.8 C)] 98.3 F (36.8 C) (10/25 0500) Pulse Rate:  [58-71] 62  (10/25 1021) Resp:  [11-22] 16  (10/24 2011) BP: (95-119)/(55-77) 103/72 mmHg (10/25 1021) SpO2:  [93 %-100 %] 97 % (10/25 0500) Weight:  [83.462 kg (184 lb)-84.823 kg (187 lb)] 83.462 kg (184 lb) (10/24 2051) Weight change:  Last BM Date: 03/06/12 Intake/Output from previous day: none documented   Intake/Output this shift:    PE: General: Heart: Lungs: Abd: Ext: Neuro:   Lab Results:  Basename 03/06/12 1510  WBC 7.8  HGB 13.9  HCT 40.7  PLT 270   BMET  Basename 03/06/12 1510  NA 140  K 3.7  CL 104  CO2 27  GLUCOSE 113*  BUN 18  CREATININE 0.70  CALCIUM 9.5    Basename 03/07/12 0607  TROPONINI <0.30    Hepatic Function Panel  Basename 03/06/12 1525  PROT 7.0  ALBUMIN 3.9  AST 14  ALT 15  ALKPHOS 78  BILITOT 0.8  BILIDIR 0.1  IBILI 0.7   EKG: Orders placed during the hospital encounter of 03/06/12  . EKG 12-LEAD  . EKG 12-LEAD  . ED EKG  . ED EKG  . ED EKG  . ED EKG    Studies/Results: Dg Chest Port 1 View  03/06/2012  *RADIOLOGY REPORT*  Clinical Data: Chest pain  PORTABLE CHEST - 1 VIEW  Comparison: 05/30/2010  Findings: Mild cardiomegaly.  Mild subsegmental bibasilar atelectasis.  No pneumothorax and no pleural effusion. Normal vascularity.  IMPRESSION: Bibasilar atelectasis.  Cardiomegaly.   Original Report Authenticated By: Donavan Burnet, M.D.     Medications: I have reviewed the patient's current medications.    [name] alum & mag hydroxide-simeth  30 mL Oral Once  . amLODipine  10 mg Oral Daily  . atenolol  50 mg Oral Daily  . enoxaparin (LOVENOX) injection  40 mg Subcutaneous Q24H  . gi cocktail  30 mL Oral Once  . ketorolac  30 mg Intravenous Once  . naproxen  500 mg Oral BID WC  . ondansetron (ZOFRAN) IV  4 mg Intravenous Once  . pantoprazole  40 mg Oral Q0600  . DISCONTD:  heparin  4,000 Units Intravenous Once   Assessment/Plan: Principal Problem:  *Chest pain on breathing Active Problems:  HTN (hypertension)  Family history of coronary artery disease  PLAN: D dimer is elevated, will check ct angio for PE and venous doppler for DVT.   Troponin I negative this am.   She is IV NTG but  vte prophl. Lovenox She is NPO.  BP low with NTG and norvasc and atenolol held Brother was 42 with CAD and stent placed. We discussed stress myoview and she would prefer not to have, she has significant asthma.   Will check ct angio, allow her to eat and MD will see to decide of cardiac cath vs. Nuc.   Cath schedule is pretty full.    D/c NTG to see if pain returns  LOS: 1 day   INGOLD,LAURA R 03/07/2012, 10:49 AM   I have seen and examined the patient along with INGOLD,LAURA R,NP.  I have reviewed the chart, notes and new data.  I agree with PA's note.  Pian does not sound typical for angina. ECG, enzymes, echo are normal. No major coronary calcification on CTA chest. Slowly enlarging >2 cm lobulated lung mass is seen on CT, associated with left bronchus  and abutting against pleura/pericardium.  PLAN: Lexiscan Myoview to exclude CAD, but expect it will be normal. Pulmonary consultation - expect she will need bronchoscopy and biopsy.  Thurmon Fair, MD, [facility] [phone] 03/07/2012, 5:48 PM

## 2012-03-07 NOTE — Progress Notes (Signed)
  Echocardiogram 2D Echocardiogram has been performed.  [name] [name] 03/07/2012, 12:42 PM

## 2012-03-08 ENCOUNTER — Inpatient Hospital Stay (HOSPITAL_COMMUNITY): Payer: BC Managed Care – PPO

## 2012-03-08 ENCOUNTER — Other Ambulatory Visit: Payer: Self-pay

## 2012-03-08 DIAGNOSIS — J45909 Unspecified asthma, uncomplicated: Secondary | ICD-10-CM

## 2012-03-08 DIAGNOSIS — R911 Solitary pulmonary nodule: Secondary | ICD-10-CM

## 2012-03-08 DIAGNOSIS — I10 Essential (primary) hypertension: Secondary | ICD-10-CM

## 2012-03-08 MED ORDER — TECHNETIUM TC 99M SESTAMIBI GENERIC - CARDIOLITE
30.0000 | Freq: Once | INTRAVENOUS | Status: AC | PRN
  Administered 2012-03-08: 30 via INTRAVENOUS

## 2012-03-08 MED ORDER — TECHNETIUM TC 99M SESTAMIBI GENERIC - CARDIOLITE
10.0000 | Freq: Once | INTRAVENOUS | Status: AC | PRN
  Administered 2012-03-08: 10 via INTRAVENOUS

## 2012-03-08 MED ORDER — PANTOPRAZOLE SODIUM 40 MG PO TBEC
40.0000 mg | DELAYED_RELEASE_TABLET | Freq: Two times a day (BID) | ORAL | Status: DC
  Administered 2012-03-08 – 2012-03-09 (×3): 40 mg via ORAL
  Filled 2012-03-08 (×2): qty 1

## 2012-03-08 MED ORDER — BISOPROLOL FUMARATE 5 MG PO TABS
5.0000 mg | ORAL_TABLET | Freq: Every day | ORAL | Status: DC
  Administered 2012-03-08 – 2012-03-10 (×3): 5 mg via ORAL
  Filled 2012-03-08 (×4): qty 1

## 2012-03-08 NOTE — Progress Notes (Signed)
[facility]  Subjective: No complaints  Objective: Vital signs in last 24 hours: Temp:  [97.9 F (36.6 C)-98.1 F (36.7 C)] 97.9 F (36.6 C) (10/26 0500) Pulse Rate:  [72-79] 79  (10/26 0500) Resp:  [18] 18  (10/25 1400) BP: (102-122)/(73-86) 102/73 mmHg (10/26 0500) SpO2:  [99 %] 99 % (10/26 0500) Last BM Date: 03/06/12  Intake/Output from previous day:   Intake/Output this shift:    Medications Current Facility-Administered Medications  Medication Dose Route Frequency Provider Last Rate Last Dose  . acetaminophen (TYLENOL) tablet 1,000 mg  1,000 mg Oral Q6H PRN Abelino Derrick, PA   1,000 mg at 03/06/12 2217  . albuterol (PROVENTIL HFA;VENTOLIN HFA) 108 (90 BASE) MCG/ACT inhaler 2 puff  2 puff Inhalation Q6H PRN Abelino Derrick, PA      . ALPRAZolam Prudy Feeler) tablet 0.5 mg  0.5 mg Oral TID PRN Abelino Derrick, PA   0.5 mg at 03/07/12 2144  . amLODipine (NORVASC) tablet 10 mg  10 mg Oral Daily Eda Paschal Buras, [place]      . bisoprolol (ZEBETA) tablet 5 mg  5 mg Oral Daily Nyoka Cowden, MD      . diphenhydrAMINE (BENADRYL) tablet 25 mg  25 mg Oral Q6H PRN Abelino Derrick, PA   25 mg at 03/07/12 2144  . enoxaparin (LOVENOX) injection 40 mg  40 mg Subcutaneous Q24H Ann Held, PHARMD   40 mg at 03/07/12 2144  . gi cocktail (Maalox,Lidocaine,Donnatal)  30 mL Oral Once Abelino Derrick, [place]      . iohexol (OMNIPAQUE) 350 MG/ML injection 100 mL  100 mL Intravenous Once PRN Medication Radiologist, MD   100 mL at 03/07/12 1312  . naproxen (NAPROSYN) tablet 500 mg  500 mg Oral BID WC Abelino Derrick, PA   500 mg at 03/07/12 1654  . ondansetron (ZOFRAN) injection 4 mg  4 mg Intravenous Q6H PRN Abelino Derrick, PA   4 mg at 03/07/12 2144  . pantoprazole (PROTONIX) EC tablet 40 mg  40 mg Oral BID AC Nyoka Cowden, MD      . regadenoson [facility]) injection SOLN 0.4 mg  0.4 mg Intravenous Once Wilburt Finlay, PA      . traMADol Janean Sark) tablet 50 mg  50 mg Oral Q6H PRN Abelino Derrick, PA      . zolpidem (AMBIEN) tablet 10 mg  10 mg Oral QHS PRN Abelino Derrick, PA   10 mg at 03/07/12 2144  . DISCONTD: atenolol (TENORMIN) tablet 50 mg  50 mg Oral Daily Eda Paschal [place], [place]      . DISCONTD: nitroGLYCERIN 0.2 mg/mL in dextrose 5 % infusion  2-200 mcg/min Intravenous Titrated Charm Barges, MD 1.5 mL/hr at 03/06/12 1726 5 mcg/min at 03/06/12 1726  . DISCONTD: pantoprazole (PROTONIX) EC tablet 40 mg  40 mg Oral Q0600 Abelino Derrick, [place]   40 mg at 03/08/12 4098    PE: General appearance: alert, cooperative and no distress Lungs: clear to auscultation bilaterally Heart: regular rate and rhythm, S1, S2 normal, no murmur, click, rub or gallop Extremities: No LEE Pulses: 2+ and symmetric Skin: Warm anfd dry Neurologic: Grossly normal  Lab Results:   Basename 03/06/12 1510  WBC 7.8  HGB 13.9  HCT 40.7  PLT 270   BMET  Basename 03/06/12 1510  NA 140  K 3.7  CL 104  CO2 27  GLUCOSE 113*  BUN 18  CREATININE  0.70  CALCIUM 9.5   Assessment/Plan  Principal Problem:  *Chest pain on breathing, negative MI. Active Problems:  HTN (hypertension)  Family history of coronary artery disease  Asthma  Pulmonary nodule  Plan:  Lexiscan myoview today.  Negative PE on CTA.  Increased size of UL lung nodule.  Pulmonary is following.  Thanks!   The patient tolerated the lexiscan well.  No acute EKG changes. Results to follow. Mildly elevated lipase.   LOS: 2 days    HAGER, BRYAN 03/08/2012 10:39 AM  I saw the patient along with Mr. Leron Croak.  I agree with his findings, exam & recommendations.  She is currently in Nuc. Med for Myoview as part of "pre-op" eval for possible thoracic Sgx.  Appreciate Pulm Med involvement.  Will review results of Myoview.  Anticipate that it will be negative.  Marykay Lex, M.D., M.S. [facility] [address] [place], [place]  [address]  [phone] Pager # [phone] 03/08/2012 1:18  PM

## 2012-03-08 NOTE — Progress Notes (Signed)
I have reviewed her Nuclear ST -- there is concern for and apical defect that does appear to be worse on stress images vs. Rest.   The EF of ~39% is contradicted by the Echo that was read as normal -- which is a more accurate test.  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  However, in light of her upcoming potential thoracic surgery, I feel that it is incumbent upon us to definitively answer the ? Of potential LAD distribution scar / ischemia.  We have discussed the potential options & she has agreed to proceed with diagnostic cardiac catheterization as 1st case on Monday AM.   The procedure with Risks/Benefits/Alternatives and Indications was reviewed with the patient & husband.  All questions were answered.    Risks / Complications include, but not limited to: Death, MI, CVA/TIA, VF/VT (with defibrillation), Bradycardia (need for temporary pacer placement), contrast induced nephropathy, bleeding / bruising / hematoma / pseudoaneurysm, vascular or coronary injury (with possible emergent CT or Vascular Surgery), adverse medication reactions, infection.    The patient (and family) voice understanding and agree to proceed.     HARDING,DAVID W, M.D., M.S. [facility] [address] [place], [place]  [address]  [phone]  03/08/2012 5:53 PM    

## 2012-03-08 NOTE — Progress Notes (Signed)
Name: [name] [name] MRN: [id number] DOB: 08/20/1960    LOS: 2  PULMONARY / CRITICAL CARE MEDICINE  HPI:   51 years old female with PMH relevant for HTN, asthma and seizures. Admitted to the Cardiology service with substernal chest pain to rule out ACS. CTA of the chest ruled out PE but showed interval increase in size of LUL nodule when compared to a CTA done in 07/29/2008.   Current Status:   Vital Signs: Temp:  [97.9 F (36.6 C)-98.1 F (36.7 C)] 97.9 F (36.6 C) (10/26 0500) Pulse Rate:  [62-79] 79  (10/26 0500) Resp:  [18] 18  (10/25 1400) BP: (102-122)/(72-86) 102/73 mmHg (10/26 0500) SpO2:  [99 %] 99 % (10/26 0500)  Physical Examination: General:  Pleasant female patient in no acute distress Neuro:  Awake, alert, oriented x 3, nonfocal HEENT:  PERRL, pink conjunctivae, moist membranes Neck:  Supple, no JVD   Cardiovascular:  RRR, no M/R/G Lungs:  CTA Abdomen:  Soft, nontender, nondistended, bowel sounds present Musculoskeletal:  Moves all extremities, no pedal edema Skin:  No rash Lymph nodes: No cervical supraclavicular, axillary or inguinal adenopathy.  Laboratory data:   Lab 03/06/12 1510  NA 140  K 3.7  CL 104  CO2 27  BUN 18  CREATININE 0.70  GLUCOSE 113*    Lab 03/06/12 1510  HGB 13.9  HCT 40.7  WBC 7.8  PLT 270      CTA of the chest done today: Findings: No pulmonary embolus is identified. Bilateral breast  implants are noted. Heart size is mildly enlarged. No pleural  effusion is identified.  A lobulated nodule measuring 1.8 x 2.2 x 2.0 cm is identified in  the left upper lobe suspicious for bronchogenic carcinoma. In  retrospect, this nodule is present on the comparison study where it  measured 1.8 x 1.3 x 1.6 cm. Also seen is some dependent  atelectasis. The lung bases are not included on the study.  Visualized liver parenchyma is unremarkable.  IMPRESSION:  1. Negative for pulmonary embolus.  2. Left upper lobe pulmonary nodule is  slowly increasing in size  since 2010 and is worrisome for bronchogenic carcinoma.    Principal Problem:  *Chest pain on breathing, negative MI. Active Problems:  HTN (hypertension)  Family history of coronary artery disease  Asthma  Pulmonary nodule   ASSESSMENT AND PLAN 51 years old female with PMH relevant for HTN, asthma and seizures. Admitted to the Cardiology service with substernal chest pain to rule out ACS. CTA of the chest ruled out PE but showed interval increase in size of LUL (prob lingular) nodule  when compared to a CTA done in 07/29/2008 These findings are concerning for a primary lung cancer.  The patient has a chronic dry cough that she attributes to her asthma (but may not be). She was a second hand smoker and her father had lung cancer.    The patient will need clinical staging with pet and outpt pfts and may be best served with going directly to excisional bx, lobectomy.  In meant time need to address her difficult to control symptoms of cough and ? Asthma by max gerd rx and change tenormin to bisoprolol a much more beta selective choice.   Discussed with husband at bedside  Sandrea Hughs, MD Pulmonary and Critical Care Medicine [facility] [phone]

## 2012-03-09 LAB — PROTIME-INR: INR: 0.99 (ref 0.00–1.49)

## 2012-03-09 MED ORDER — BISACODYL 10 MG RE SUPP
10.0000 mg | Freq: Once | RECTAL | Status: AC
  Administered 2012-03-09: 10 mg via RECTAL
  Filled 2012-03-09: qty 1

## 2012-03-09 NOTE — Progress Notes (Signed)
[facility]  Subjective: No complaints.  Objective: Vital signs in last 24 hours: Temp:  [97.7 F (36.5 C)-97.9 F (36.6 C)] 97.9 F (36.6 C) (10/27 0500) Pulse Rate:  [61-112] 61  (10/27 0500) Resp:  [16-20] 16  (10/27 0500) BP: (95-185)/(59-111) 108/71 mmHg (10/27 0956) SpO2:  [94 %-98 %] 94 % (10/27 0500) Last BM Date: 03/06/12  Intake/Output from previous day: 10/26 0701 - 10/27 0700 In: 240 [P.O.:240] Out: -  Intake/Output this shift:    Medications Current Facility-Administered Medications  Medication Dose Route Frequency Provider Last Rate Last Dose  . acetaminophen (TYLENOL) tablet 1,000 mg  1,000 mg Oral Q6H PRN Abelino Derrick, PA   1,000 mg at 03/06/12 2217  . albuterol (PROVENTIL HFA;VENTOLIN HFA) 108 (90 BASE) MCG/ACT inhaler 2 puff  2 puff Inhalation Q6H PRN Abelino Derrick, PA      . ALPRAZolam Prudy Feeler) tablet 0.5 mg  0.5 mg Oral TID PRN Abelino Derrick, PA   0.5 mg at 03/08/12 2137  . amLODipine (NORVASC) tablet 10 mg  10 mg Oral Daily Eda Paschal [place], [place]   10 mg at 03/09/12 0956  . bisoprolol (ZEBETA) tablet 5 mg  5 mg Oral Daily Nyoka Cowden, MD   5 mg at 03/09/12 0957  . diphenhydrAMINE (BENADRYL) tablet 25 mg  25 mg Oral Q6H PRN Abelino Derrick, PA   25 mg at 03/08/12 2137  . enoxaparin (LOVENOX) injection 40 mg  40 mg Subcutaneous Q24H Ann Held, PHARMD   40 mg at 03/08/12 2200  . gi cocktail (Maalox,Lidocaine,Donnatal)  30 mL Oral Once Abelino Derrick, [place]      . naproxen (NAPROSYN) tablet 500 mg  500 mg Oral BID WC Abelino Derrick, PA   500 mg at 03/09/12 0759  . ondansetron (ZOFRAN) injection 4 mg  4 mg Intravenous Q6H PRN Abelino Derrick, PA   4 mg at 03/09/12 0802  . pantoprazole (PROTONIX) EC tablet 40 mg  40 mg Oral BID AC Nyoka Cowden, MD   40 mg at 03/09/12 0759  . regadenoson (LEXISCAN) injection SOLN 0.4 mg  0.4 mg Intravenous Once Wilburt Finlay, PA   0.4 mg at 03/08/12 1153  . technetium sestamibi generic (CARDIOLITE)  injection 30 milli Curie  30 milli Curie Intravenous Once PRN Medication Radiologist, MD   30 milli Curie at 03/08/12 1155  . traMADol (ULTRAM) tablet 50 mg  50 mg Oral Q6H PRN Abelino Derrick, PA      . zolpidem (AMBIEN) tablet 10 mg  10 mg Oral QHS PRN Abelino Derrick, PA   10 mg at 03/08/12 2137    PE: General appearance: alert, cooperative and no distress Lungs: clear to auscultation bilaterally Heart: regular rate and rhythm, S1, S2 normal, no murmur, click, rub or gallop Extremities: No LEE Pulses: 2+ and symmetric  Lab Results:   Basename 03/06/12 1510  WBC 7.8  HGB 13.9  HCT 40.7  PLT 270   BMET  Basename 03/06/12 1510  NA 140  K 3.7  CL 104  CO2 27  GLUCOSE 113*  BUN 18  CREATININE 0.70  CALCIUM 9.5   Studies/Results: NUCLEAR MEDICINE MYOCARDIAL PERFUSION IMAGING  NUCLEAR MEDICINE LEFT VENTRICULAR WALL MOTION ANALYSIS  NUCLEAR MEDICINE LEFT VENTRICULAR EJECTION FRACTION CALCULATION  Technique: Standard single day myocardial SPECT imaging was  performed after resting intravenous injection of Tc-64m Myoview.  After intravenous infusion of Lexiscan (regadenoson) under  supervision of  cardiology staff, Myoview was injected intravenously  and standard myocardial SPECT imaging was performed. Quantitative  gated imaging was also performed to evaluate left ventricular wall  motion and estimate left ventricular ejection fraction.  Radiopharmaceutical: 10.2 +33 mCi Tc20m Myoview IV.  Comparison: None  Findings: The stress SPECT images demonstrate mildly decreased  apical and anteroapical activity, with otherwise physiologic  distribution of radiopharmaceutical. Rest images demonstrate  relatively improved anteroapical activity with a persistent apical  perfusion defect, no new perfusion defects. The gated stress  SPECT images demonstrate normal left ventricular myocardial  thickening. No focal wall motion abnormality is seen. Calculated  left ventricular end-diastolic  volume 51ml, end-systolic volume  31ml, ejection fraction of 39%.  IMPRESSION  1. Fixed apical defect with a small area of adjacent anteroapical  ischemia.  2. Left ventricular ejection fraction 39%.  Assessment/Plan  Principal Problem:  *Chest pain on breathing, negative MI. Active Problems:  HTN (hypertension)  Family history of coronary artery disease  Asthma  Pulmonary nodule  Plan:  S/P Lexiscan myoview  Which indicated a fixed apical defect with a small area of adjacent anteroapical ischemia.  .  EF of 39% by myoview but 55-60% by echo on 10/25.  Plan LHC cath tomorrow to be certain.   LOS: 3 days    HAGER, BRYAN 03/09/2012 10:25 AM  I have seen & examined the patient today & agree with Mr. Jasper Riling findings, exam & recommendations.  Plan LHC in AM to confirm/deny presence of LAD distribution ischemia / infarct suggested by Myoview.  Most likely teh defect is related to shifting breast attenuation due to breast implants.  Seen by Pulm Med today -- plan to complete w/u of Lung mass as OP.  Marykay Lex, M.D., M.S. [facility] [address] [place], [place]  [address]  [phone] Pager # [phone] 03/09/2012 12:07 PM

## 2012-03-09 NOTE — Progress Notes (Signed)
Name: [name] [name] MRN: [id number] DOB: [birth year]    LOS: 3  PULMONARY / CRITICAL CARE MEDICINE  HPI:   55 yowf never smoker with PMH relevant for HTN, asthma and seizures. Admitted to the Cardiology service with substernal chest pain to rule out ACS. CTA of the chest ruled out PE but showed interval increase in size of LUL nodule when compared to a CTA done in 07/29/2008.   Current Status: Ambulatory s 02, cough on inspiration  "for years" worse in fall   Vital Signs: Temp:  [97.7 F (36.5 C)-97.9 F (36.6 C)] 97.9 F (36.6 C) (10/27 0500) Pulse Rate:  [61-112] 61  (10/27 0500) Resp:  [16-20] 16  (10/27 0500) BP: (95-185)/(59-111) 108/71 mmHg (10/27 0956) SpO2:  [94 %-98 %] 94 % (10/27 0500)  Physical Examination: General:  Pleasant female patient in no acute distress Neuro:  Awake, alert, oriented x 3, nonfocal HEENT:  PERRL, pink conjunctivae, moist membranes Neck:  Supple, no JVD   Cardiovascular:  RRR, no M/R/G Lungs:  CTA but coughs on inspiration Abdomen:  Soft, nontender, nondistended, bowel sounds present Musculoskeletal:  Moves all extremities, no pedal edema Skin:  No rash Lymph nodes: No cervical supraclavicular, axillary or inguinal adenopathy.  Laboratory data:   Lab 03/06/12 1510  NA 140  K 3.7  CL 104  CO2 27  BUN 18  CREATININE 0.70  GLUCOSE 113*    Lab 03/06/12 1510  HGB 13.9  HCT 40.7  WBC 7.8  PLT 270      CTA of the chest  10/25 Findings: No pulmonary embolus is identified. Bilateral breast  implants are noted. Heart size is mildly enlarged. No pleural  effusion is identified.  A lobulated nodule measuring 1.8 x 2.2 x 2.0 cm is identified in  the left upper lobe suspicious for bronchogenic carcinoma. In  retrospect, this nodule is present on the comparison study where it  measured 1.8 x 1.3 x 1.6 cm. Also seen is some dependent  atelectasis. The lung bases are not included on the study.  Visualized liver parenchyma is  unremarkable.  IMPRESSION:  1. Negative for pulmonary embolus.  2. Left upper lobe pulmonary nodule is slowly increasing in size  since 2010 and is worrisome for bronchogenic carcinoma.    Principal Problem:  *Chest pain on breathing, negative MI. Active Problems:  HTN (hypertension)  Family history of coronary artery disease  Asthma  Pulmonary nodule   ASSESSMENT AND PLAN 89 yowf with PMH relevant for HTN, asthma and seizures. Admitted to the Cardiology service with substernal chest pain to rule out ACS. CTA of the chest ruled out PE but showed interval increase in size of LUL (prob lingular) nodule  when compared to a CTA done in 07/29/2008 These findings are concerning for a primary lung cancer.  The patient has a chronic dry cough that she attributes to her asthma (but may not be). She was a second hand smoker and her father had lung cancer.    The patient will need clinical staging with pet and outpt pfts and may be best served with going directly to excisional bx, lobectomy.  In meant time need to address her difficult to control symptoms of cough and ? Asthma by max gerd rx and change tenormin to bisoprolol a much more beta selective choice.     Advised we will need to see her as soon as possible p discharge to arrange pfts and PET - will ask our NP to arrange this but  no further f/u as inpt planned   Sandrea Hughs, MD Pulmonary and Critical Care Medicine [facility] [phone]

## 2012-03-09 NOTE — Progress Notes (Signed)
Pt c/o of nausea this am Zofran given will cont. To monitor

## 2012-03-10 ENCOUNTER — Encounter (HOSPITAL_COMMUNITY)
Admission: EM | Disposition: A | Discharge status: Home / Self Care | Payer: Self-pay | Source: Home / Self Care | Attending: Internal Medicine

## 2012-03-10 ENCOUNTER — Telehealth: Payer: Self-pay | Admitting: Internal Medicine

## 2012-03-10 DIAGNOSIS — R911 Solitary pulmonary nodule: Secondary | ICD-10-CM

## 2012-03-10 HISTORY — PX: LEFT HEART CATHETERIZATION WITH CORONARY ANGIOGRAM: SHX5451

## 2012-03-10 SURGERY — LEFT HEART CATHETERIZATION WITH CORONARY ANGIOGRAM
Anesthesia: LOCAL

## 2012-03-10 MED ORDER — SODIUM CHLORIDE 0.9 % IV SOLN: 1.0000 mL/kg/h | INTRAVENOUS | Status: DC

## 2012-03-10 MED ORDER — MIDAZOLAM HCL 2 MG/2ML IJ SOLN
INTRAMUSCULAR | Status: AC
  Filled 2012-03-10: qty 2

## 2012-03-10 MED ORDER — SODIUM CHLORIDE 0.9 % IV SOLN: 250.0000 mL | INTRAVENOUS | Status: DC | PRN

## 2012-03-10 MED ORDER — ONDANSETRON HCL 4 MG/2ML IJ SOLN
INTRAMUSCULAR | Status: AC
  Filled 2012-03-10: qty 2

## 2012-03-10 MED ORDER — SODIUM CHLORIDE 0.9 % IJ SOLN: 3.0000 mL | Freq: Two times a day (BID) | INTRAMUSCULAR | Status: DC

## 2012-03-10 MED ORDER — ACETAMINOPHEN 325 MG PO TABS: 650.0000 mg | ORAL_TABLET | ORAL | Status: DC | PRN

## 2012-03-10 MED ORDER — ASPIRIN 81 MG PO CHEW
324.0000 mg | CHEWABLE_TABLET | Freq: Once | ORAL | Status: AC
  Administered 2012-03-10: 324 mg via ORAL
  Filled 2012-03-10: qty 4

## 2012-03-10 MED ORDER — MORPHINE SULFATE 2 MG/ML IJ SOLN: 1.0000 mg | INTRAMUSCULAR | Status: DC | PRN

## 2012-03-10 MED ORDER — HEPARIN (PORCINE) IN NACL 2-0.9 UNIT/ML-% IJ SOLN
INTRAMUSCULAR | Status: AC
  Filled 2012-03-10: qty 1000

## 2012-03-10 MED ORDER — LIDOCAINE HCL (PF) 1 % IJ SOLN
INTRAMUSCULAR | Status: AC
  Filled 2012-03-10: qty 30

## 2012-03-10 MED ORDER — SODIUM CHLORIDE 0.9 % IV SOLN
1.0000 mL/kg/h | INTRAVENOUS | Status: DC
Start: 2012-03-10 — End: 2012-03-10

## 2012-03-10 MED ORDER — ONDANSETRON HCL 4 MG/2ML IJ SOLN
4.0000 mg | Freq: Four times a day (QID) | INTRAMUSCULAR | Status: DC | PRN
  Administered 2012-03-10: 4 mg via INTRAVENOUS

## 2012-03-10 MED ORDER — FENTANYL CITRATE 0.05 MG/ML IJ SOLN
INTRAMUSCULAR | Status: AC
  Filled 2012-03-10: qty 2

## 2012-03-10 MED ORDER — HEPARIN SODIUM (PORCINE) 1000 UNIT/ML IJ SOLN
INTRAMUSCULAR | Status: AC
  Filled 2012-03-10: qty 1

## 2012-03-10 MED ORDER — VERAPAMIL HCL 2.5 MG/ML IV SOLN
INTRAVENOUS | Status: AC
  Filled 2012-03-10: qty 2

## 2012-03-10 MED ORDER — ASPIRIN 81 MG PO CHEW: 324.0000 mg | CHEWABLE_TABLET | ORAL | Status: DC

## 2012-03-10 MED ORDER — NITROGLYCERIN 0.2 MG/ML ON CALL CATH LAB
INTRAVENOUS | Status: AC
  Filled 2012-03-10: qty 1

## 2012-03-10 MED ORDER — SODIUM CHLORIDE 0.9 % IJ SOLN: 3.0000 mL | INTRAMUSCULAR | Status: DC | PRN

## 2012-03-10 NOTE — Telephone Encounter (Signed)
Schedule PET asap and then ov the following day and we'll do pft's (bedside ok if Tammy Wilson's full)

## 2012-03-10 NOTE — H&P (View-Only) (Signed)
I have reviewed her Nuclear ST -- there is concern for and apical defect that does appear to be worse on stress images vs. Rest.   The EF of ~39% is contradicted by the Echo that was read as normal -- which is a more accurate test.  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  However, in light of her upcoming potential thoracic surgery, I feel that it is incumbent upon [place] to definitively answer the ? Of potential LAD distribution scar / ischemia.  We have discussed the potential options & she has agreed to proceed with diagnostic cardiac catheterization as 1st case on Monday AM.   The procedure with Risks/Benefits/Alternatives and Indications was reviewed with the patient & husband.  All questions were answered.    Risks / Complications include, but not limited to: Death, MI, CVA/TIA, VF/VT (with defibrillation), Bradycardia (need for temporary pacer placement), contrast induced nephropathy, bleeding / bruising / hematoma / pseudoaneurysm, vascular or coronary injury (with possible emergent CT or Vascular Surgery), adverse medication reactions, infection.    The patient (and family) voice understanding and agree to proceed.     Marykay Lex, M.D., M.S. [facility] [address] [place], [place]  [address]  [phone]  03/08/2012 5:53 PM

## 2012-03-10 NOTE — Telephone Encounter (Signed)
Dr. Sherene Sires please clarify do you want pt to have PFT/PET prior to appt or wait until she comes in to be seen. thanks

## 2012-03-10 NOTE — CV Procedure (Signed)
[facility] PERCUTANEOUS CORONARY INTERVENTION REPORT  NAME:  [name] [name]   MRN: [id number] DOB:  Sep 17, 1960   ADMIT DATE: 03/06/2012  INTERVENTIONAL CARDIOLOGIST: Marykay Lex, M.D., MS PRIMARY CARE PROVIDER: No primary provider on file. PRIMARY CARDIOLOGIST: Hilty, Kenneth C. ([place]), MD, [facility]  PATIENT:  [name] [name] is a 51 y.o. female who presented with substernal chest pain relieved with nitroglycerin. It is a constant squeezing symptom without radiation. She ruled out for myocardial infarction, but as far as her workup, she underwent PE protocol CT which showed a lung mass.  She was therefore continued in her cardiac evaluation as she may require thoracic surgery. She had an echocardiogram which revealed normal ejection fraction is a normal heart but had a less scan Myoview which demonstrated and tragal filling defect that appeared to be worse with stress and rest images concerning for anterior ischemia in the LAD distribution. She does have a history of breast implantation which could very well explain this defect, however in light of her up potential upcoming surgery is deemed necessary to clarify this results. She is therefore referred for diagnostic catheterization today.  PRE-OPERATIVE DIAGNOSIS:    Chest pain with abnormal Myoview stress test  PROCEDURES PERFORMED:    Left Heart Catheterization with Native Coronary Angiography via 5 French right radial artery access  PROCEDURE: Consent: Risks of procedure as well as the alternatives and risks of each were explained to the (patient/caregiver).  Consent for procedure obtained. Consent for signed by MD and patient with RN witness -- placed on chart.  PROCEDURE: The patient was brought to the 2nd Floor [facility] in the fasting state and prepped and draped in the usual sterile fashion for Right wrist or groin access. A modified Allen's test with plethysmography was performed  demonstrating excellent Ulnar Artery collateral flow, supple for Radial Artery Access. Sterile technique was used including antiseptics, cap, gloves, gown, hand hygiene, mask and sheet.  Skin prep: Chlorhexidine.  Time Out: Verified patient identification, verified procedure, site/side was marked, verified correct patient position, special equipment/implants available, medications/allergies/relevent history reviewed, required imaging and test results available.  Performed  Access:  Right Radial Artery;  5 Fr  Glide Sheath - Seldinger technique using the Angiocath Micropuncture Kit  Diagnostic Angiography and Hemodynamic Measurement:  TIG 4.0 catheters advanced over Versicore wire , other cath was exchanged over a long exchange safety-J wire   Left Coronary Artery Angiography: TIG 4.0  Right Coronary Artery Angiography: JR 4  LV Hemodynamics (LV Gram): JR 4 advanced across the valve for hemodynamic measurement  Hemodynamics:  Central Aortic / Mean Pressures:  114/78 mmHg;  94 mmHg  Left Ventricular Pressures / EDP:  108/16 mmHg;  6 mmHg  Left Ventriculography: Not performed, echocardiogram recently done  Coronary Anatomy:  Left Main:  Large-caliber vessel bifurcates into LAD and Circumflex with a high large Obtuse Marginal versus Ramus Intermedius LAD:  Large-caliber vessel that gives off a large proximal diagonal branch and several large septal perforators one proximally and one at the diagonal and one in the mid segment. The vessel then tapers down after getting off a smaller second diagonal branch in the mid vessel and barely reaches the apex. Angiographic normal.  D1:  Moderate large-caliber vessel with minimal luminal irregularities, it bifurcates in the mid vessel covering a large anterolateral distribution.  D2:  Very small caliber bifurcating vessel covering a small distribution. Left Circumflex:  Moderate caliber vessel that arises from the Ramus Intermedius ;  courses in the AV  groove and terminates as a small posterior lateral branch. Angiographically normal Ramus Intermedius: Large-caliber vessel that after giving rise to the true Circumflex bifurcates into 2 moderate caliber of this branches. Angiographically normal  OM1: This is a superior branch from the Ramus that courses in what is a true ramus distribution it bifurcates distally and reaches almost to the upper apex. Angiographically normal  OM 2: Inferior branch from the Ramus Coursey Moran obtuse marginal distribution covering the basal inferolateral wall. Angiographically normal  RCA:  Very large caliber dominant vessel with a very proximal conus branch one small 1 very small artery marginal branch. The vessel is angiographic normal as it courses posteriorly bifurcates into the RPDA and Right Posterior Atrioventricular Groove Branch (RPAVB).    RPDA: Large-caliber, dominant artery with a large distal septal trunk. The vessel reaches all the way to the apex and provides flow to part of the anterior apex.  Right Posterior Lateral Sysytem: The RPAVB is a moderate large-caliber vessel that gives rise to a small RPL 1 followed by 2 moderate caliber Right Posterior Lateral branches with the most distal branch giving off the AV nodal artery.  TR Band:  0950 Hours,  14 mL air  ANESTHESIA:   Local Lidocaine  2 ml SEDATION:  4 mg IV Versed,  50 mcg IV fentanyl ; Premedication:  5 mg oral Valium MEDICATIONS: Radial Cocktail: 5 mg Verapamil, 400 mcg NTG, 2 ml 2% Lidocaine in 10 ml NS   Anticoagulation: 4000 units IV heparin  EBL:   < 5 ml  PATIENT DISPOSITION:    The patient was transferred to the PACU holding area in a hemodynamicaly stable, chest pain free condition.  The patient tolerated the procedure well, and there were no complications.  The patient was stable before, during, and after the procedure.  POST-OPERATIVE DIAGNOSIS:    Angiographically normal coronary arteries with no evidence of any  significant stenoses.  False-positive nuclear stress test.  Known normal LV function by Echocardiogram, normal LVEDP.  PLAN OF CARE:   Standard post radial cath care is to discharge second and once recovered.  She should be fine to proceed with potential thoracic surgery/lobectomy.   Marykay Lex, M.D., M.S. [facility] [address] [place], [place]  [address]  [phone]  03/10/2012 10:15 AM

## 2012-03-10 NOTE — Interval H&P Note (Signed)
History and Physical Interval Note:  03/10/2012 8:55 AM  [name] [name]  has presented today for surgery, with the diagnosis of chest pain & abnormal Myoview ST.  [name] She has remained stable over the weekend.    The various methods of treatment have been discussed with the patient and family. After consideration of risks, benefits and other options for treatment, the patient has consented to  Procedure(s) (LRB) with comments: LEFT HEART CATHETERIZATION WITH CORONARY ANGIOGRAM (N/A) as a surgical intervention .  The patient's history has been reviewed, patient examined, no change in status, stable for surgery.  I have reviewed the patient's chart and labs.  Questions were answered to the patient's satisfaction.     [name]

## 2012-03-10 NOTE — Progress Notes (Signed)
Patient to follow up with Dr. Sherene Sires 10/31 at 1015 per office to set up time for PFTs and PET at that time.

## 2012-03-11 NOTE — Discharge Summary (Signed)
Physician Discharge Summary  Patient ID: [name] [name] MRN: [id number] DOB/AGE: [birth year] 51 y.o.  Admit date: 03/06/2012 Discharge date: 03/11/2012  Admission Diagnoses:  Chest pain  Discharge Diagnoses:  Principal Problem:  *Chest pain on breathing, negative MI. Active Problems:  HTN (hypertension)  Family history of coronary artery disease  Asthma  Pulmonary nodule   Discharged Condition: stable  Hospital Course:   51 y/o female with no cardiac history, presents to the ER today with complaints of SSCP. Her symptoms started 3 am, worse when she sits up. Her symptoms eased and she went on to work. At work her symptoms got worse. In the ER no relief with Mylanta or Toradol. She is still complaining of pain="like a golf ball under my sternum" No radiation to her arms, some worsening with deep breathing. No hemoptysis.  She was admitted and ruled out for ACS.  Ddimer was mildly elevated.  CT angio showed no PE, however, there was an interval increase in the size of a lobulated nodule in the left upper lobe.  PCCm was consulted.  Lexiscan myoview was completed and fixed apical defect with a small area of adjacent anteroapical ischemia.  Left ventricular ejection fraction 39%.  Subsequent left heart cath revealed normal coronaries.  Echo showed EF 55-60%.  She was discharged home in stable condition after being seen by Dr. Herbie Baltimore.  Follow up with PCCM for Biopsy.  Consults: PCCM  Significant Diagnostic Studies:   2D echo  Study Conclusions  - Left ventricle: The cavity size was normal. There was mild concentric hypertrophy. Systolic function was normal. The estimated ejection fraction was in the range of 55% to 60%. Wall motion was normal; there were no regional wall motion abnormalities. Doppler parameters are consistent with abnormal left ventricular relaxation (grade 1 diastolic dysfunction). - Mitral valve: Mild regurgitation.   CT ANGIOGRAPHY CHEST  Technique:  Multidetector CT imaging of the chest using the  standard protocol during bolus administration of intravenous  contrast. Multiplanar reconstructed images including MIPs were  obtained and reviewed to evaluate the vascular anatomy.  Contrast: OMNIPAQUE IOHEXOL 350 MG/ML SOLN  Comparison: CT chest 07/29/2008. Plain film of the chest  03/06/2012 and PA and lateral chest 05/30/2010.  Findings: No pulmonary embolus is identified. Bilateral breast  implants are noted. Heart size is mildly enlarged. No pleural  effusion is identified.  A lobulated nodule measuring 1.8 x 2.2 x 2.0 cm is identified in  the left upper lobe suspicious for bronchogenic carcinoma. In  retrospect, this nodule is present on the comparison study where it  measured 1.8 x 1.3 x 1.6 cm. Also seen is some dependent  atelectasis. The lung bases are not included on the study.  Visualized liver parenchyma is unremarkable.  IMPRESSION:  1. Negative for pulmonary embolus.  2. Left upper lobe pulmonary nodule is slowly increasing in size  since 2010 and is worrisome for bronchogenic carcinoma.  NUCLEAR MEDICINE LEFT VENTRICULAR EJECTION FRACTION CALCULATION  Technique: Standard single day myocardial SPECT imaging was  performed after resting intravenous injection of Tc-78m Myoview.  After intravenous infusion of Lexiscan (regadenoson) under  supervision of cardiology staff, Myoview was injected intravenously  and standard myocardial SPECT imaging was performed. Quantitative  gated imaging was also performed to evaluate left ventricular wall  motion and estimate left ventricular ejection fraction.  Radiopharmaceutical: 10.2 +33 mCi Tc71m Myoview IV.  Comparison: None  Findings: The stress SPECT images demonstrate mildly decreased  apical and anteroapical activity, with otherwise physiologic  distribution  of radiopharmaceutical. Rest images demonstrate  relatively improved anteroapical activity with a persistent apical    perfusion defect, no new perfusion defects. The gated stress  SPECT images demonstrate normal left ventricular myocardial  thickening. No focal wall motion abnormality is seen. Calculated  left ventricular end-diastolic volume 51ml, end-systolic volume  31ml, ejection fraction of 39%.  IMPRESSION  1. Fixed apical defect with a small area of adjacent anteroapical  ischemia.  2. Left ventricular ejection fraction 39%.  CBC    Component Value Date/Time   WBC 7.8 03/06/2012 1510   RBC 4.92 03/06/2012 1510   HGB 13.9 03/06/2012 1510   HCT 40.7 03/06/2012 1510   PLT 270 03/06/2012 1510   MCV 82.7 03/06/2012 1510   MCH 28.3 03/06/2012 1510   MCHC 34.2 03/06/2012 1510   RDW 13.1 03/06/2012 1510   LYMPHSABS 1.2 04/20/2010 1357   MONOABS 0.4 04/20/2010 1357   EOSABS 0.2 04/20/2010 1357   BASOSABS 0.1 04/20/2010 1357   BMET    Component Value Date/Time   NA 140 03/06/2012 1510   K 3.7 03/06/2012 1510   CL 104 03/06/2012 1510   CO2 27 03/06/2012 1510   GLUCOSE 113* 03/06/2012 1510   BUN 18 03/06/2012 1510   CREATININE 0.70 03/06/2012 1510   CALCIUM 9.5 03/06/2012 1510   GFRNONAA >90 03/06/2012 1510   GFRAA >90 03/06/2012 1510   Discharge Exam: Blood pressure 118/83, pulse 66, temperature 98.3 F (36.8 C), temperature source Oral, resp. rate 18, height 5\' 3"  (1.6 m), weight 84.777 kg (186 lb 14.4 oz), SpO2 94.00%.   Disposition: 01-Home or Self Care  Discharge Orders    Future Appointments: Provider: Department: Dept Phone: Center:   03/19/2012 8:30 AM [facility] [phone] [place]   03/20/2012 10:30 AM Nyoka Cowden, MD [facility] [phone] None     Future Orders Please Complete By Expires   Diet - low sodium heart healthy      Increase activity slowly      Discharge instructions      Comments:   No lifting more than a half gallon of milk with your right arm for three days       Medication List     As of 03/11/2012  1:43 PM     TAKE these medications         acetaminophen 500 MG tablet   Commonly known as: TYLENOL   Take 1,000 mg by mouth every 6 (six) hours as needed. For pain      albuterol 108 (90 BASE) MCG/ACT inhaler   Commonly known as: PROVENTIL HFA;VENTOLIN HFA   Inhale 2 puffs into the lungs every 6 (six) hours as needed. For shortness of breath      ALPRAZolam 0.5 MG tablet   Commonly known as: XANAX   Take 0.5 mg by mouth 3 (three) times daily as needed. For anxiety      amLODipine 10 MG tablet   Commonly known as: NORVASC   Take 10 mg by mouth daily.      atenolol 50 MG tablet   Commonly known as: TENORMIN   Take 50 mg by mouth daily.      diphenhydrAMINE 25 MG tablet   Commonly known as: BENADRYL   Take 25 mg by mouth every 6 (six) hours as needed. For sleep      KRILL OIL PO   Take 1 capsule by mouth daily.        Signed: Wilburt Finlay  03/11/2012, 1:43 PM  Mrs. Haught was admitted with Atypical CP & found to have lung mass on CTA of chest.  Normal Echo, but Anteroapical filling defect on Myoview --> confirmed to be false + due to Breast (implant) attenuation with LHC/Angiography on the day of d/c.  She is ready for d/c after cath recovery.  Standard post-radial cath care. Continue Lung Mass w/u as OP with PCCM.  Marykay Lex, M.D., M.S. [facility] [address] [place], [place]  [address]  [phone] Pager # [phone] 03/12/2012 11:40 AM

## 2012-03-11 NOTE — Telephone Encounter (Signed)
Pet scheduled for 03/19/12 and hfu 03/20/12 Tobe Sos

## 2012-03-11 NOTE — Telephone Encounter (Signed)
Order has been placed for PET to be scheduled ASAP. Once scheduled pt needs HFU w/ PFT next day if possible. Please advise PCC's once scheduled

## 2012-03-13 ENCOUNTER — Inpatient Hospital Stay: Payer: BC Managed Care – PPO | Admitting: Internal Medicine

## 2012-03-13 ENCOUNTER — Ambulatory Visit (INDEPENDENT_AMBULATORY_CARE_PROVIDER_SITE_OTHER): Payer: BC Managed Care – PPO | Admitting: Internal Medicine

## 2012-03-13 DIAGNOSIS — R911 Solitary pulmonary nodule: Secondary | ICD-10-CM

## 2012-03-13 LAB — PULMONARY FUNCTION TEST

## 2012-03-13 NOTE — Progress Notes (Signed)
PFT done today. 

## 2012-03-19 ENCOUNTER — Encounter: Payer: Self-pay | Admitting: Internal Medicine

## 2012-03-19 ENCOUNTER — Encounter (HOSPITAL_COMMUNITY)
Admission: RE | Admit: 2012-03-19 | Discharge: 2012-03-19 | Disposition: A | Discharge status: Home / Self Care | Payer: BC Managed Care – PPO | Source: Ambulatory Visit | Attending: Internal Medicine | Admitting: Internal Medicine

## 2012-03-19 DIAGNOSIS — R911 Solitary pulmonary nodule: Secondary | ICD-10-CM | POA: Insufficient documentation

## 2012-03-19 LAB — GLUCOSE, CAPILLARY: Glucose-Capillary: 94 mg/dL (ref 70–99)

## 2012-03-19 MED ORDER — FLUDEOXYGLUCOSE F - 18 (FDG) INJECTION
16.2000 | Freq: Once | INTRAVENOUS | Status: AC | PRN
  Administered 2012-03-19: 16.2 via INTRAVENOUS

## 2012-03-20 ENCOUNTER — Encounter: Payer: Self-pay | Admitting: Internal Medicine

## 2012-03-20 ENCOUNTER — Ambulatory Visit (INDEPENDENT_AMBULATORY_CARE_PROVIDER_SITE_OTHER): Payer: BC Managed Care – PPO | Admitting: Internal Medicine

## 2012-03-20 VITALS — BP 118/84 | HR 59 | Temp 97.9°F | Ht 63.0 in | Wt 187.0 lb

## 2012-03-20 DIAGNOSIS — I10 Essential (primary) hypertension: Secondary | ICD-10-CM

## 2012-03-20 DIAGNOSIS — R911 Solitary pulmonary nodule: Secondary | ICD-10-CM

## 2012-03-20 DIAGNOSIS — J45909 Unspecified asthma, uncomplicated: Secondary | ICD-10-CM

## 2012-03-20 MED ORDER — PANTOPRAZOLE SODIUM 40 MG PO TBEC: 40.0000 mg | DELAYED_RELEASE_TABLET | Freq: Every day | ORAL | Status: AC

## 2012-03-20 MED ORDER — NEBIVOLOL HCL 5 MG PO TABS: 5.0000 mg | ORAL_TABLET | Freq: Every day | ORAL | Status: DC

## 2012-03-20 MED ORDER — FAMOTIDINE 20 MG PO TABS: ORAL_TABLET | ORAL | Status: AC

## 2012-03-20 NOTE — Progress Notes (Signed)
Subjective:     Patient ID: [name] [name], female   DOB: November 26, 1960  MRN: [id number]  HPI Admit date: 03/06/2012  Discharge date: 03/11/2012  Admission Diagnoses: Chest pain  Discharge Diagnoses:  Principal Problem:  *Chest pain on breathing, negative MI.  Active Problems:  HTN (hypertension)  Family history of coronary artery disease  Asthma  Pulmonary nodule    Discharged Condition: stable  Hospital Course:  51 y/o female with no cardiac history, presents to the ER c SSCP. Her symptoms started 3 am, worse when she sits up. Her symptoms eased and she went on to work. At work her symptoms got worse. In the ER no relief with Mylanta or Toradol.   She was admitted and ruled out for ACS. Ddimer was mildly elevated. CT angio showed no PE, however, there was an interval increase in the size of a lobulated nodule in the left upper lobe. PCCm was consulted. Lexiscan myoview was completed and fixed apical defect with a small area of adjacent anteroapical ischemia. Left ventricular ejection fraction 39%. Subsequent left heart cath revealed normal coronaries. Echo showed EF 55-60%. She was discharged home in stable condition after being seen by Dr. Herbie Baltimore. Follow up with PCCM .    Significant Diagnostic Studies:  2D echo  Study Conclusions - Left ventricle: The cavity size was normal. There was mild concentric hypertrophy. Systolic function was normal. The estimated ejection fraction was in the range of 55% to 60%. Wall motion was normal; there were no regional wall motion abnormalities. Doppler parameters are consistent with abnormal left ventricular relaxation (grade 1 diastolic dysfunction). - Mitral valve: Mild regurgitation.  03/19/12 PET c/w carcinoid ? Airway involvement  03/20/2012 f/u ov/Dominque Marlin never smoker cough indolent onset x one  year and doe x 4-5 years assoc with about 40 lb wt gain with dx of asthma but really not better p saba.  Cough is dry, never bloody, day > night. Doe x  heavy housework or fast walk. No obvious daytime variabilty ex cp or chest tightness, subjective wheeze overt sinus or hb symptoms. No unusual exp hx or h/o childhood pna/ asthma or premature birth to her knowledge.   Sleeping ok without nocturnal  or early am exacerbation  of respiratory  c/o's or need for noct saba. Also denies any obvious fluctuation of symptoms with weather or environmental changes or other aggravating or alleviating factors except as outlined above  ROS  The following are not active complaints unless bolded sore throat, dysphagia, dental problems, itching, sneezing,  nasal congestion or excess/ purulent secretions, ear ache,   fever, chills, sweats, unintended wt loss, pleuritic or exertional cp, hemoptysis,  orthopnea pnd or leg swelling, presyncope, palpitations, heartburn, abdominal pain, anorexia, nausea, vomiting, diarrhea  or change in bowel or urinary habits, change in stools or urine, dysuria,hematuria,  rash, arthralgias, visual complaints, headache, numbness weakness or ataxia or problems with walking or coordination,  change in mood/affect or memory.         Review of Systems     Objective:   Physical Exam    amb wf nad Wt Readings from Last 3 Encounters:  03/20/12 187 lb (84.823 kg)  03/09/12 186 lb 14.4 oz (84.777 kg)  03/09/12 186 lb 14.4 oz (84.777 kg)  HEENT: nl dentition, turbinates, and orophanx. Nl external ear canals without cough reflex   NECK :  without JVD/Nodes/TM/ nl carotid upstrokes bilaterally   LUNGS: no acc muscle use, clear to A and P bilaterally without cough on insp  or exp maneuvers   CV:  RRR  no s3 or murmur or increase in P2, no edema   ABD:  soft and nontender with nl excursion in the supine position. No bruits or organomegaly, bowel sounds nl  MS:  warm without deformities, calf tenderness, cyanosis or clubbing  SKIN: warm and dry without lesions    NEURO:  alert, approp, no deficits   Assessment:          Plan:

## 2012-03-20 NOTE — Progress Notes (Signed)
Quick Note:  This was discussed at ov per MW ______

## 2012-03-20 NOTE — Patient Instructions (Addendum)
Stop krill oil and tenormin  Start bystolic 5 mg daily  Try Protonix 40 mg  Take 30-60 min before first meal of the day and Pepcid 20 mg one bedtime   Please see patient coordinator before you leave today  to schedule referral T surgery   GERD (REFLUX)  is an extremely common cause of respiratory symptoms, many times with no significant heartburn at all.    It can be treated with medication, but also with lifestyle changes including avoidance of late meals, excessive alcohol, smoking cessation, and avoid fatty foods, chocolate, peppermint, colas, red wine, and acidic juices such as orange juice.  NO MINT OR MENTHOL PRODUCTS SO NO COUGH DROPS  USE SUGARLESS CANDY INSTEAD (jolley ranchers or Stover's)  NO OIL BASED VITAMINS - use powdered substitutes.   Follow up here after surgery if not 100% better in terms of cough

## 2012-03-21 ENCOUNTER — Institutional Professional Consult (permissible substitution) (INDEPENDENT_AMBULATORY_CARE_PROVIDER_SITE_OTHER): Payer: BC Managed Care – PPO | Admitting: Cardiothoracic Surgery

## 2012-03-21 ENCOUNTER — Encounter (HOSPITAL_COMMUNITY)
Admission: RE | Admit: 2012-03-21 | Discharge: 2012-03-21 | Disposition: A | Discharge status: Home / Self Care | Payer: BC Managed Care – PPO | Source: Ambulatory Visit | Attending: Cardiothoracic Surgery | Admitting: Cardiothoracic Surgery

## 2012-03-21 ENCOUNTER — Other Ambulatory Visit: Payer: Self-pay

## 2012-03-21 ENCOUNTER — Encounter (HOSPITAL_COMMUNITY): Payer: Self-pay

## 2012-03-21 ENCOUNTER — Encounter: Payer: Self-pay | Admitting: Cardiothoracic Surgery

## 2012-03-21 VITALS — BP 123/80 | HR 64 | Temp 97.9°F | Resp 20 | Ht 63.0 in | Wt 187.1 lb

## 2012-03-21 VITALS — BP 119/85 | HR 66 | Resp 20 | Ht 63.0 in | Wt 187.0 lb

## 2012-03-21 DIAGNOSIS — R911 Solitary pulmonary nodule: Secondary | ICD-10-CM

## 2012-03-21 DIAGNOSIS — D381 Neoplasm of uncertain behavior of trachea, bronchus and lung: Secondary | ICD-10-CM

## 2012-03-21 HISTORY — DX: Insomnia, unspecified: G47.00

## 2012-03-21 HISTORY — DX: Shortness of breath: R06.02

## 2012-03-21 HISTORY — DX: Anemia, unspecified: D64.9

## 2012-03-21 HISTORY — DX: Personal history of urinary calculi: Z87.442

## 2012-03-21 HISTORY — DX: Other specified postprocedural states: Z98.890

## 2012-03-21 HISTORY — DX: Urgency of urination: R39.15

## 2012-03-21 HISTORY — DX: Other specified postprocedural states: R11.2

## 2012-03-21 HISTORY — DX: Nocturia: R35.1

## 2012-03-21 HISTORY — DX: Dermatitis, unspecified: L30.9

## 2012-03-21 HISTORY — DX: Pain in unspecified joint: M25.50

## 2012-03-21 HISTORY — DX: Gastro-esophageal reflux disease without esophagitis: K21.9

## 2012-03-21 HISTORY — DX: Panic disorder (episodic paroxysmal anxiety): F41.0

## 2012-03-21 HISTORY — DX: Concussion with loss of consciousness of unspecified duration, initial encounter: S06.0X9A

## 2012-03-21 HISTORY — DX: Concussion with loss of consciousness status unknown, initial encounter: S06.0XAA

## 2012-03-21 HISTORY — DX: Frequency of micturition: R35.0

## 2012-03-21 LAB — COMPREHENSIVE METABOLIC PANEL
ALT: 16 U/L (ref 0–35)
AST: 15 U/L (ref 0–37)
Albumin: 4.2 g/dL (ref 3.5–5.2)
Alkaline Phosphatase: 78 U/L (ref 39–117)
BUN: 16 mg/dL (ref 6–23)
CO2: 20 mEq/L (ref 19–32)
Calcium: 9.6 mg/dL (ref 8.4–10.5)
Chloride: 106 mEq/L (ref 96–112)
Creatinine, Ser: 0.68 mg/dL (ref 0.50–1.10)
GFR calc Af Amer: >90 mL/min (ref >90)
GFR calc non Af Amer: >90 mL/min (ref >90)
Glucose, Bld: 94 mg/dL (ref 70–99)
Potassium: 3.8 mEq/L (ref 3.5–5.1)
Sodium: 140 mEq/L (ref 135–145)
Total Bilirubin: 0.8 mg/dL (ref 0.3–1.2)
Total Protein: 7.1 g/dL (ref 6.0–8.3)

## 2012-03-21 LAB — ABO/RH: ABO/RH(D): O POS

## 2012-03-21 LAB — PROTIME-INR
INR: 1.01 (ref 0.00–1.49)
Prothrombin Time: 13.2 seconds (ref 11.6–15.2)

## 2012-03-21 LAB — URINALYSIS, ROUTINE W REFLEX MICROSCOPIC
Bilirubin Urine: NEGATIVE
Glucose, UA: NEGATIVE mg/dL
Hgb urine dipstick: NEGATIVE
Ketones, ur: NEGATIVE mg/dL
Leukocytes, UA: NEGATIVE
Nitrite: NEGATIVE
Protein, ur: NEGATIVE mg/dL
Specific Gravity, Urine: 1.019 (ref 1.005–1.030)
Urobilinogen, UA: 0.2 mg/dL (ref 0.0–1.0)
pH: 5 (ref 5.0–8.0)

## 2012-03-21 LAB — CBC
HCT: 42.3 % (ref 36.0–46.0)
Hemoglobin: 14.5 g/dL (ref 12.0–15.0)
MCH: 28.2 pg (ref 26.0–34.0)
MCHC: 34.3 g/dL (ref 30.0–36.0)
MCV: 82.3 fL (ref 78.0–100.0)
Platelets: 306 10*3/uL (ref 150–400)
RBC: 5.14 MIL/uL — ABNORMAL HIGH (ref 3.87–5.11)
RDW: 13 % (ref 11.5–15.5)
WBC: 6.2 10*3/uL (ref 4.0–10.5)

## 2012-03-21 LAB — TYPE AND SCREEN
ABO/RH(D): O POS
Antibody Screen: NEGATIVE

## 2012-03-21 LAB — BLOOD GAS, ARTERIAL
Acid-base deficit: 1.1 mmol/L (ref 0.0–2.0)
Bicarbonate: 22.8 mEq/L (ref 20.0–24.0)
Drawn by: 206361
FIO2: 0.21 %
O2 Saturation: 95.5 %
Patient temperature: 98.6
TCO2: 23.9 mmol/L (ref 0–100)
pCO2 arterial: 36.2 mmHg (ref 35.0–45.0)
pH, Arterial: 7.416 (ref 7.350–7.450)
pO2, Arterial: 71.8 mmHg — ABNORMAL LOW (ref 80.0–100.0)

## 2012-03-21 LAB — SURGICAL PCR SCREEN
MRSA, PCR: NEGATIVE
Staphylococcus aureus: NEGATIVE

## 2012-03-21 LAB — APTT: aPTT: 36 seconds (ref 24–37)

## 2012-03-21 MED ORDER — DEXTROSE 5 % IV SOLN: 1.5000 g | INTRAVENOUS | Status: DC

## 2012-03-21 NOTE — Progress Notes (Signed)
[address]            [place] [address]          [phone]      [name] [name] [facility] Medical Record #[id number] Date of Birth: 1960/08/14  Referring: Nyoka Cowden, MD Primary Care: Nonnie Done., MD  Chief Complaint:    Chief Complaint  Patient presents with  . Lung Mass    Referral from Dr Sherene Sires for surgical eval on Left upper lobe pulmonary nodule, PET Scan 03/19/12, Chest CT 03/07/12    History of Present Illness:          Current Activity/ Functional Status: Patient is independent with mobility/ambulation, transfers, ADL's, IADL's.   Past Medical History  Diagnosis Date  . Anxiety   . Obesity   . Hypertension     takes Amlodipine and Bystolic daily  . Asthma     since childhood-uses albuterol prn  . Shortness of breath     with exertion  . Seizures     as a child d/t hit to head;and about 4-54yrs ago came back and then  came back;last one 2+yrs ago  . Concussion     as a child  . Joint pain     d/t right leg break  . Eczema     on hands   . GERD (gastroesophageal reflux disease)     takes Protonix-started 03/20/12 and Pepcid   . Urinary frequency   . History of kidney stones   . Nocturia   . Urinary urgency   . Anemia     hx of-controlled by hysterectomy  . Panic attacks     takes Xanax daily  . Insomnia     takes Benadryl nightly  . PONV (postoperative nausea and vomiting)     b/p low and slow to come off of o2    Past Surgical History  Procedure Date  . Orif ankle fracture 9 months ago    Rt ankle  . Abdominal hysterectomy   . Total abdominal hysterectomy w/ bilateral salpingoophorectomy   . Breast enhancement surgery with implants 15 years ago   . Cardiac catheterization 2013  . Cyst removed from ovaries 35yr ago  . Tubal ligation     Family History  Problem Relation Age of Onset  . Heart failure Mother   . Heart failure Father   . Heart failure Brother   . COPD Mother   . COPD Father     . COPD Sister Died after lung transplant at [facility] age 37  . Hypertension Brother   . Hypercholesterolemia Brother   . Stroke Mother     History   Social History  . Marital Status: Married    Spouse Name: N/A    Number of Children: 3  . Years of Education: N/A   Occupational History  . nurse     case Production designer, theatre/television/film at [employer], retirement community   Social History Main Topics  . Smoking status: Never Smoker   . Smokeless tobacco: Never Used  . Alcohol Use: 0.6 oz/week    1 Glasses of wine per week     Comment: Occasionally  . Drug Use: No  . Sexually Active: Yes    Birth Control/ Protection: Surgical                 History  Smoking status  .  Never Smoker   Smokeless tobacco  . Never Used    History  Alcohol Use  . 0.6 oz/week  . 1 Glasses of wine per week    Comment: Occasionally     Allergies  Allergen Reactions  . Dilantin (Phenytoin Sodium Extended) Other (See Comments)    Reaction unknown, from Childhood   . Keppra (Levetiracetam) Other (See Comments)     Paralysis of lt hand/arm  . Phenobarbital Other (See Comments)    Reaction unknown, from childhood  . Vimpat (Lacosamide) Other (See Comments)    Severe confusion    Current Outpatient Prescriptions  Medication Sig Dispense Refill  . acetaminophen (TYLENOL) 500 MG tablet Take 1,000 mg by mouth every 6 (six) hours as needed. For pain      . albuterol (PROVENTIL HFA;VENTOLIN HFA) 108 (90 BASE) MCG/ACT inhaler Inhale 2 puffs into the lungs every 6 (six) hours as needed. For shortness of breath      . ALPRAZolam (XANAX) 0.5 MG tablet Take 0.5 mg by mouth 3 (three) times daily as needed. For anxiety      . amLODipine (NORVASC) 10 MG tablet Take 10 mg by mouth daily.      . diphenhydrAMINE (BENADRYL) 25 MG tablet Take 25 mg by mouth every 6 (six) hours as needed. For sleep      . famotidine (PEPCID) 20 MG tablet One at bedtime  30 tablet  2  . nebivolol (BYSTOLIC) 5 MG tablet Take 1  tablet (5 mg total) by mouth daily.      . pantoprazole (PROTONIX) 40 MG tablet Take 1 tablet (40 mg total) by mouth daily. Take 30-60 min before first meal of the day  30 tablet  2       Review of Systems:     Cardiac Review of Systems: Y or N  Chest Pain [  y  ]  Resting SOB [ n  ] Exertional SOB  [ y ]  Orthopnea [  ]   Pedal Edema [   ]    Palpitations [  ] Syncope  [  ]   Presyncope [   ]  General Review of Systems: [Y] = yes [  ]=no Constitional: recent weight change [  ]; anorexia [  ]; fatigue [  ]; nausea [  ]; night sweats [  ]; fever [ n ]; or chills [ n ];                                                                                                                                          Dental: poor dentition[ n ]; Last Dentist visit:   Eye : blurred vision [  ]; diplopia [   ]; vision changes [  ];  Amaurosis fugax[  ]; Resp: cough [  ];  wheezing[  ];  hemoptysis[  ]; shortness of breath[  ];  paroxysmal nocturnal dyspnea[  ]; dyspnea on exertion[  ]; or orthopnea[  ];  GI:  gallstones[  ], vomiting[  ];  dysphagia[  ]; melena[  ];  hematochezia [  ]; heartburn[  ];   Hx of  Colonoscopy[y  ]; GU: kidney stones [  ]; hematuria[  ];   dysuria [  ];  nocturia[  ];  history of     obstruction [  ];             Skin: rash, swelling[  ];, hair loss[  ];  peripheral edema[  ];  or itching[  ]; Musculosketetal: myalgias[  ];  joint swelling[  ];  joint erythema[  ];  joint pain[  ];  back pain[  ];  Heme/Lymph: bruising[  ];  bleeding[  ];  anemia[  ];  Neuro: TIA[  ];  headaches[  ];  stroke[  ];  vertigo[  ];  seizures[  ];   paresthesias[  ];  difficulty walking[  ];  Psych:depression[  ]; anxiety[  ];  Endocrine: diabetes[  ];  thyroid dysfunction[  ];  Immunizations: Flu [ y ]; Pneumococcal[ n ];  Other:  Physical Exam: BP 119/85  Pulse 66  Resp 20  Ht 5\' 3"  (1.6 m)  Wt 187 lb (84.823 kg)  BMI 33.13 kg/m2  SpO2 94%  General appearance: alert, cooperative and  appears stated age Neurologic: intact Heart: regular rate and rhythm, S1, S2 normal, no murmur, click, rub or gallop and normal apical impulse Lungs: clear to auscultation bilaterally and normal percussion bilaterally Abdomen: soft, non-tender; bowel sounds normal; no masses,  no organomegaly Extremities: extremities normal, atraumatic, no cyanosis or edema and Homans sign is negative, no sign of DVT   Diagnostic Studies & Laboratory data:     Recent Radiology Findings:   Dg Chest 2 View  03/21/2012  *RADIOLOGY REPORT*  Clinical Data: Lung mass, preoperative assessment of left lung surgery  CHEST - 2 VIEW  Comparison: 03/06/2012 Correlation:  PET CT 03/19/2012, CT chest 03/07/2012  Findings: Upper normal heart size. Tortuous aorta. Pulmonary vascularity normal. Mass identified on prior CT adjacent to the left heart border is poorly visualized. Remaining lungs clear. Underlying emphysematous changes. No pleural effusion or pneumothorax. No acute osseous findings.  IMPRESSION: Mild emphysematous changes. Left lung mass adjacent to left heart border on prior CT and PET CT exams is suboptimally visualized radiographically.   Original Report Authenticated By: Ulyses Southward, M.D.    Ct Angio Chest Pe W/cm &/or Wo Cm  03/07/2012  *RADIOLOGY REPORT*  Clinical Data: Chest pain.  Elevated D-dimer.  CT ANGIOGRAPHY CHEST  Technique:  Multidetector CT imaging of the chest using the standard protocol during bolus administration of intravenous contrast. Multiplanar reconstructed images including MIPs were obtained and reviewed to evaluate the vascular anatomy.  Contrast: OMNIPAQUE IOHEXOL 350 MG/ML SOLN  Comparison: CT chest 07/29/2008.  Plain film of the chest 03/06/2012 and PA and lateral chest 05/30/2010.  Findings: No pulmonary embolus is identified.  Bilateral breast implants are noted.  Heart size is mildly enlarged.  No pleural effusion is identified.  A lobulated nodule measuring 1.8 x 2.2 x 2.0 cm is  identified in the left upper lobe suspicious for bronchogenic carcinoma. In retrospect, this nodule is present on the comparison study where it measured 1.8 x 1.3 x 1.6 cm.  Also seen is some dependent atelectasis.  The lung bases are not included on the study. Visualized liver parenchyma is  unremarkable.  IMPRESSION:  1.  Negative for pulmonary embolus. 2.  Left upper lobe pulmonary nodule is slowly increasing in size since 2010 and is worrisome for bronchogenic carcinoma.  Critical Value/emergent results were called by telephone at the time of interpretation on 03/07/2012 at 1:40 p.m. to Hetty Ely, who verbally acknowledged these results.   Original Report Authenticated By: Bernadene Bell. Maricela Curet, M.D.    Nm Myocar Multi W/spect W/wall Motion / Ef  03/08/2012  *RADIOLOGY REPORT*  Clinical data: Chest pain, asthma.  NUCLEAR MEDICINE MYOCARDIAL PERFUSION IMAGING NUCLEAR MEDICINE LEFT VENTRICULAR WALL MOTION ANALYSIS NUCLEAR MEDICINE LEFT VENTRICULAR EJECTION FRACTION CALCULATION  Technique: Standard single day myocardial SPECT imaging was performed after resting intravenous injection of Tc-9m Myoview. After intravenous infusion of Lexiscan (regadenoson) under supervision of cardiology staff, Myoview was injected intravenously and standard myocardial SPECT imaging was performed. Quantitative gated imaging was also performed to evaluate left ventricular wall motion and estimate left ventricular ejection fraction.  Radiopharmaceutical: 10.2 +33 mCi Tc2m Myoview IV.  Comparison: None  Findings:    The stress SPECT images demonstrate mildly decreased apical and anteroapical activity, with otherwise physiologic distribution of radiopharmaceutical. Rest images demonstrate relatively improved anteroapical activity with a persistent apical perfusion defect, no new   perfusion defects. The gated stress SPECT images demonstrate normal left ventricular myocardial thickening.  No focal wall motion abnormality is seen.  Calculated left ventricular end-diastolic volume 51ml, end-systolic volume 31ml, ejection fraction of 39%.  IMPRESSION  1. Fixed apical defect with a small area of adjacent anteroapical ischemia.  2. Left ventricular ejection fraction 39%.   Original Report Authenticated By: Osa Craver, M.D.    Nm Pet Image Initial (pi) Skull Base To Thigh  03/19/2012  *RADIOLOGY REPORT*  Clinical Data: Initial treatment strategy for enlarging left upper lobe pulmonary nodule. No history of malignancy.  NUCLEAR MEDICINE PET SKULL BASE TO THIGH  Fasting Blood Glucose:  94  Technique:  16.2 mCi F-18 FDG was injected intravenously. CT data was obtained and used for attenuation correction and anatomic localization only.  (This was not acquired as a diagnostic CT examination.) Additional exam technical data entered on technologist worksheet.  Comparison:  Chest CTA 03/07/2012. Chest CT 07/29/2008.  Findings:  Neck: No hypermetabolic lymph nodes in the neck.  Chest:  The lingular nodule is very difficult to evaluate with PET CT given the close proximity to the left ventricular myocardium. The nodule measures 2.0 x 1.4 cm on image 89, unchanged from the recent chest CTA.  There is very prominent activity within the adjacent myocardium.  Mildly increased activity within the nodule cannot be excluded although does not appear intense on the three- dimensional images.  There is no hypermetabolic nodal activity.  Abdomen/Pelvis:  No abnormal hypermetabolic activity within the liver, pancreas, adrenal glands, or spleen.  No hypermetabolic lymph nodes in the abdomen or pelvis.  There is a possible tiny nonobstructing calculus in the lower pole of the left kidney.  Skeleton:  No focal hypermetabolic activity to suggest skeletal metastasis.  IMPRESSION:  1.  The enlarging lingular nodule is difficult to evaluate with PET CT due to the close proximity with the left ventricular myocardium which shows prominent physiologic activity. 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  This does appear to have an endobronchial component on the CT.  Bronchoscopic evaluation recommended. 2.  No evidence of metastatic  disease.   Original Report Authenticated By: Carey Bullocks, M.D.    07/2008 CT: RADIOLOGY REPORT*  Clinical Data: Shortness of breath. Surgery 7 days ago.  CT ANGIOGRAPHY CHEST  Technique: Multidetector CT imaging of the chest was performed using the standard protocol during bolus administration of intravenous contrast. Multiplanar CT image reconstructions including MIPs were obtained to evaluate the vascular anatomy.  Contrast: 80 ml Optiray 350  Comparison: Plain film chest 07/29/2008.  Findings: No pulmonary embolus is identified. There is no pleural or pericardial effusion. Heart size is normal. Bilateral breast implants noted. No hilar, axillary mediastinal lymphadenopathy. There is some linear atelectatic change in the right lung base. Lungs otherwise clear. Airway is unremarkable. Visualized upper abdomen is unremarkable. There is no focal bony abnormality.  Review of the MIP images confirms the above findings.  IMPRESSION: Negative for pulmonary embolus. Negative examination.   The left upper lobe lung mass is not mentioned in this report or was present on review of the CT from 2010 and has enlarged slightly from that time        Recent Lab Findings: Lab Results  Component Value Date   WBC 6.2 03/21/2012   HGB 14.5 03/21/2012   HCT 42.3 03/21/2012   PLT 306 03/21/2012   GLUCOSE 94 03/21/2012   ALT 16 03/21/2012   AST 15 03/21/2012   NA 140 03/21/2012   K 3.8 03/21/2012   CL 106 03/21/2012   CREATININE 0.68 03/21/2012   BUN 16 03/21/2012   CO2 20 03/21/2012   INR 1.01 03/21/2012      Assessment / Plan:     #1 slowly enlarging left upper lobe lung nodule along the left  cardiac border, documented previously on CT scan in 2010 but now has enlarged slightly. Masses found incidentally on a CT scan to rule out pulmonary embolus, patient is also recently undergone cardiac catheterization without obvious cardiac disease.  I recommended to her proceeding with bronchoscopy left video-assisted thoracoscopy and lung resection and possible lobectomy for enlarging lung nodule. The risks and options have been discussed with the patient and her husband in detail and she is anxious to proceed as soon as possible. We tentatively arrange for surgery November 12.     Delight Ovens MD  Beeper [phone] Office [phone] 03/21/2012 4:14 PM

## 2012-03-21 NOTE — Progress Notes (Signed)
Saw Dr.Hilty and a cardiac cath was performed and normal--was having chest pain and it was ruled as epigastric  Stress test/echo/heart cath reports in epic  Dr.Slatosky in [facility] in [place] is medical   EKG in epic within past month

## 2012-03-21 NOTE — Pre-Procedure Instructions (Signed)
[address]  03/21/2012   Your procedure is scheduled on:  Tues, Nov 12 @ 2:06 PM  Report to [facility] at 8:30 AM.  Call this number if you have problems the morning of surgery: [phone]   Remember:   Do not eat food:After Midnight.    Take these medicines the morning of surgery with A SIP OF WATER: Albuterol<Bring Your Inhaler With You>,Alprazolam(Xanax),Amlodipine(Norvasc),Pepcid(Famotidine),Nebivolol(Bystolic),and Pantoprazole(Protonix)   Do not wear jewelry, make-up or nail polish.  Do not wear lotions, powders, or perfumes. You may wear deodorant.  Do not shave 48 hours prior to surgery.   Do not bring valuables to the hospital.  Contacts, dentures or bridgework may not be worn into surgery.  Leave suitcase in the car. After surgery it may be brought to your room.  For patients admitted to the hospital, checkout time is 11:00 AM the day of discharge.   Patients discharged the day of surgery will not be allowed to drive home.    Special Instructions: Shower using CHG 2 nights before surgery and the night before surgery.  If you shower the day of surgery use CHG.  Use special wash - you have one bottle of CHG for all showers.  You should use approximately 1/3 of the bottle for each shower.   Please read over the following fact sheets that you were given: Pain Booklet, Coughing and Deep Breathing, Blood Transfusion Information, MRSA Information and Surgical Site Infection Prevention

## 2012-03-21 NOTE — Patient Instructions (Signed)
Pulmonary Nodule A pulmonary (lung) nodule is small, round growth in the lung. The size of a pulmonary nodule can be as small as a pencil eraser (1/5 inch or 4 millmeters) to a little bigger than your biggest toenail (1 inch or 25 millimeters). A pulmonary nodule is usually an unplanned finding. It may be found on a chest X-ray or a computed tomography (CT) scan when you have imaging tests of your lungs done. When a pulmonary nodule is found, tests will be done to determine if the nodule is benign (not cancerous) or malignant (cancerous). Follow-up treatment or testing is based on the size of the pulmonary nodule and your risk of getting lung cancer.  CAUSES Causes of pulmonary nodules can vary.  Benign pulmonary nodules  can be caused from different things. Some of these things include:  Infection. This can be a common cause of a benign pulmonary nodule. The infection may be active (a current infection) or an old infection that is no longer active. Three types of infections can cause a pulmonary nodule. These are:  Bacterial Infection.  Fungal infection.  Viral Infections.  Hematoma. This is a bruise in the lung. A hematoma can happen from an injury to your chest.  Some common diseases can lead to benign pulmonary nodules. For example, rheumatoid arthritis can be a cause of a pulmonary nodule.  Other unusual things can cause a benign pulmonary nodule. These can include:  Having had tuberculosis.  Rare diseases, such as a lung cyst. Malignant pulmonary nodules.  These are cancerous growths. The cancer may have:  Started in the lung. Some lung cancers first detected as a pulmonary nodule.  Spread to the lung from cancer somewhere else in the body. This is called metastatic cancer.  Certain risk factors make a cancerous pulmonary nodule more likely. They include:  Age. As people get older, a pulmonary nodule is more likely to be cancerous.  Cancer history. If one of your immediate  family members has had cancer, you have a higher risk of developing cancer.  Smoking. This includes people who currently smoke and those who have quit. DIAGNOSIS To diagnose whether a pulmonary nodule is benign or malignant, a variety of tests will be done. This includes things such as:  Health history. Questions regarding your current health, past health, and family health will be asked.  Blood tests. Results of blood work can show:  Tumor markers for cancer.  Any type of infection.  A skin test called a tuberculin (TB) test may be done. This test can tell if you have been exposed to the germ that causes tuberculosis.  Imaging tests. These take pictures of your lungs. Types of imaging tests include:  Chest X-ray. This can help in several ways. An X-ray gives a close-up look at the pulmonary nodule. A new X-ray can be compared with any X-rays you have had in the past.   Computed tomography  (CT) scan. This test shows smaller pulmonary nodules more clearly than an X-ray.  Positron emission tomography  (PET) scan. This is a test that uses a radioactive substance to identify a pulmonary nodule. A safe amount of radioactive substance is injected into the blood stream. Then, the scan takes a picture of the pulmonary nodule. A malignant pulmonary nodule will absorb the substance faster than a benign pulmonary nodule. The radioactive substance is eliminated from your body in your urine.  Biopsy.  This removes a tiny piece of the pulmonary nodule so it can be checked   under a microscope. Medicine will be given to help keep you relaxed and pain free when a biopsy is done. Types of biopsies include:  Bronchoscopy . This is a surgical procedure. It can be used for pulmonary nodules that are close to the airways in the lung. It uses a scope (a thin tube) with a tiny camera and light on the end. The scope is put in the windpipe. Your caregiver can then see inside the lung. A tiny tool put through the  scope is used to take a small sample of the pulmonary nodule tissue.  Transthoracic needle aspiration . This method is used if the pulmonary nodule is far away from the air passages in the lung. A long, thin needle is put through the chest into the lung nodule. A CT scan is done at the same time which can make it easier to locate the pulmonary nodule.  Surgical lung biopsy . This is a surgical procedure in which the pulmonary nodule is removed. This is usually recommended when the pulmonary nodule is most likely malignant or a biopsy cannot be obtained by either bronchoscopy or transthoracic needle aspiration. PULMONARY NODULE FOLLOW-UP RECOMMENDATIONS The frequency of pulmonary nodule follow-up is based on your risk factors and size of the pulmonary nodule. If your caregiver suspects the pulmonary nodule is cancerous or the pulmonary nodule changes during any of the follow-up CT scans, additional testing or biopsies will be done.   If you have no or low risk of getting lung cancer (non-smoker, no personal cancer history), recommended follow-up is based on the following pulmonary nodule size:  A pulmonary nodule that is < 4 mm does not require any follow-up.  A pulmonary nodule that is 4 to 6 mm should be re-imaged by CT scan in 12 months.  A pulmonary nodule that is 6 to 8 mm should be re-imaged by CT scan at 6 to 12 months and then again at 18 to 24 months if no change in size.  A pulmonary nodule > 8 mm in size should be followed closely and re-imaged by CT scan at 3, 9, and 24 months.   If you are at risk of getting lung cancer (current or former smoker, family history of cancer), recommended follow-up is based on the following pulmonary nodule size:  A pulmonary nodule that is < 4 mm in size should be re-imaged by CT scan in 12 months.  A pulmonary nodule that is 4 to 6 mm in size should be re-imaged by CT scan at 6 to 12 months and again at 18 to 24 months.  A pulmonary nodule that is  6 to 8 mm in size should be re-imaged by CT scan at 3, 9, and 24 months.  A pulmonary nodule > 8 mm in size should be followed closely and re-imaged by CT scan at 3, 9, and 24 months. SEEK MEDICAL CARE IF: While waiting for test results to determine what type of pulmonary nodule you have, be sure to contact your caregiver if you:  Have trouble breathing when you are active.  Feel sick or unusually tired.  Do not feel like eating.  Lose weight without trying to.  Develop chills or night sweats.  Mild or moderate fevers generally have no long-term effects and often do not require treatment. There are a few exceptions (see below). SEEK IMMEDIATE MEDICAL CARE IF:  You cannot catch your breath or you begin wheezing.  You cannot stop coughing.  You cough up blood.    You feel like you are going to pass out or become dizzy.  You have sudden chest pain.  You have a fever or persistent symptoms for more than 72 hours.  You have a fever and your symptoms suddenly get worse. MAKE SURE YOU   Understand these instructions.  Will watch your condition.  Will get help right away if you are not doing well or get worse. Document Released: 02/25/2009 Document Revised: 07/23/2011 Document Reviewed: 02/25/2009 [employer] Patient Information 2013 [employer].  

## 2012-03-22 ENCOUNTER — Encounter: Payer: Self-pay | Admitting: Internal Medicine

## 2012-03-22 NOTE — Assessment & Plan Note (Addendum)
-   very minimal airflow obstruction 03/13/12 pfts  Not clear what component if any of her symptoms are related to asthma but if asthmatic Strongly prefer in this setting: Bystolic, the most beta -1  selective Beta blocker available in sample form, with bisoprolol the most selective generic choice  on the market.   Also rec avoid oil based supplements in case this is pseudo asthma related to extra esophageal gerd  No contraindication to lobectomy if needed for surgical cure.

## 2012-03-22 NOTE — Assessment & Plan Note (Signed)
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  Discussed in detail all the  indications, usual  risks and alternatives  relative to the benefits with patient who agrees to proceed with t surg referral as planned

## 2012-03-22 NOTE — Assessment & Plan Note (Signed)
Changed tenormin to bystolic due to concerns ? Asthma 03/20/12

## 2012-03-24 MED ORDER — DEXTROSE 5 % IV SOLN
1.5000 g | INTRAVENOUS | Status: AC
  Administered 2012-03-25: 1.5 g via INTRAVENOUS

## 2012-03-25 ENCOUNTER — Encounter (HOSPITAL_COMMUNITY): Payer: Self-pay | Admitting: Anesthesiology

## 2012-03-25 ENCOUNTER — Inpatient Hospital Stay (HOSPITAL_COMMUNITY): Payer: BC Managed Care – PPO | Admitting: Anesthesiology

## 2012-03-25 ENCOUNTER — Inpatient Hospital Stay (HOSPITAL_COMMUNITY): Payer: BC Managed Care – PPO

## 2012-03-25 ENCOUNTER — Encounter (HOSPITAL_COMMUNITY): Payer: Self-pay | Admitting: *Deleted

## 2012-03-25 ENCOUNTER — Inpatient Hospital Stay (HOSPITAL_COMMUNITY)
Admission: RE | Admit: 2012-03-25 | Discharge: 2012-03-30 | DRG: 538 | Disposition: A | Discharge status: Home / Self Care | Payer: BC Managed Care – PPO | Source: Ambulatory Visit | Attending: Cardiothoracic Surgery | Admitting: Cardiothoracic Surgery

## 2012-03-25 ENCOUNTER — Encounter (HOSPITAL_COMMUNITY)
Admission: RE | Disposition: A | Discharge status: Home / Self Care | Payer: Self-pay | Source: Ambulatory Visit | Attending: Cardiothoracic Surgery

## 2012-03-25 ENCOUNTER — Encounter (HOSPITAL_COMMUNITY): Payer: Self-pay | Admitting: Certified Registered Nurse Anesthetist

## 2012-03-25 ENCOUNTER — Encounter: Payer: BC Managed Care – PPO | Admitting: Cardiothoracic Surgery

## 2012-03-25 DIAGNOSIS — L259 Unspecified contact dermatitis, unspecified cause: Secondary | ICD-10-CM | POA: Diagnosis present

## 2012-03-25 DIAGNOSIS — R911 Solitary pulmonary nodule: Secondary | ICD-10-CM

## 2012-03-25 DIAGNOSIS — D381 Neoplasm of uncertain behavior of trachea, bronchus and lung: Secondary | ICD-10-CM

## 2012-03-25 DIAGNOSIS — Z9889 Other specified postprocedural states: Secondary | ICD-10-CM

## 2012-03-25 DIAGNOSIS — K219 Gastro-esophageal reflux disease without esophagitis: Secondary | ICD-10-CM | POA: Diagnosis present

## 2012-03-25 DIAGNOSIS — J95811 Postprocedural pneumothorax: Secondary | ICD-10-CM | POA: Diagnosis not present

## 2012-03-25 DIAGNOSIS — I10 Essential (primary) hypertension: Secondary | ICD-10-CM | POA: Diagnosis present

## 2012-03-25 DIAGNOSIS — D62 Acute posthemorrhagic anemia: Secondary | ICD-10-CM | POA: Diagnosis not present

## 2012-03-25 DIAGNOSIS — J45909 Unspecified asthma, uncomplicated: Secondary | ICD-10-CM | POA: Diagnosis present

## 2012-03-25 DIAGNOSIS — E669 Obesity, unspecified: Secondary | ICD-10-CM | POA: Diagnosis present

## 2012-03-25 DIAGNOSIS — G479 Sleep disorder, unspecified: Secondary | ICD-10-CM | POA: Diagnosis present

## 2012-03-25 DIAGNOSIS — D3A090 Benign carcinoid tumor of the bronchus and lung: Secondary | ICD-10-CM

## 2012-03-25 DIAGNOSIS — F411 Generalized anxiety disorder: Secondary | ICD-10-CM | POA: Diagnosis present

## 2012-03-25 HISTORY — PX: VIDEO ASSISTED THORACOSCOPY (VATS)/ LOBECTOMY: SHX6169

## 2012-03-25 HISTORY — PX: VIDEO BRONCHOSCOPY: SHX5072

## 2012-03-25 SURGERY — BRONCHOSCOPY, VIDEO-ASSISTED
Anesthesia: General | Site: Chest | Wound class: Clean Contaminated

## 2012-03-25 MED ORDER — AMLODIPINE BESYLATE 10 MG PO TABS
10.0000 mg | ORAL_TABLET | Freq: Every day | ORAL | Status: DC
  Administered 2012-03-26 – 2012-03-30 (×5): 10 mg via ORAL
  Filled 2012-03-25 (×5): qty 1

## 2012-03-25 MED ORDER — ACETAMINOPHEN 10 MG/ML IV SOLN
1000.0000 mg | Freq: Four times a day (QID) | INTRAVENOUS | Status: AC
  Administered 2012-03-25 – 2012-03-26 (×4): 1000 mg via INTRAVENOUS
  Filled 2012-03-25 (×5): qty 100

## 2012-03-25 MED ORDER — CEFUROXIME SODIUM 1.5 G IJ SOLR
INTRAMUSCULAR | Status: AC
  Filled 2012-03-25: qty 1.5

## 2012-03-25 MED ORDER — KCL IN DEXTROSE-NACL 20-5-0.45 MEQ/L-%-% IV SOLN
INTRAVENOUS | Status: AC
  Filled 2012-03-25: qty 1000

## 2012-03-25 MED ORDER — BISACODYL 5 MG PO TBEC
10.0000 mg | DELAYED_RELEASE_TABLET | Freq: Every day | ORAL | Status: DC
  Administered 2012-03-26 – 2012-03-29 (×4): 10 mg via ORAL
  Filled 2012-03-25 (×4): qty 2

## 2012-03-25 MED ORDER — PANTOPRAZOLE SODIUM 40 MG PO TBEC
40.0000 mg | DELAYED_RELEASE_TABLET | Freq: Every day | ORAL | Status: DC
  Administered 2012-03-26 – 2012-03-30 (×5): 40 mg via ORAL
  Filled 2012-03-25 (×5): qty 1

## 2012-03-25 MED ORDER — SODIUM CHLORIDE 0.9 % IJ SOLN: 9.0000 mL | INTRAMUSCULAR | Status: DC | PRN

## 2012-03-25 MED ORDER — NEOSTIGMINE METHYLSULFATE 1 MG/ML IJ SOLN
INTRAMUSCULAR | Status: DC | PRN
  Administered 2012-03-25: 4 mg via INTRAVENOUS

## 2012-03-25 MED ORDER — BUPIVACAINE HCL 0.5 % IJ SOLN
INTRAMUSCULAR | Status: DC | PRN
  Administered 2012-03-25: 30 mL

## 2012-03-25 MED ORDER — FENTANYL 10 MCG/ML IV SOLN
INTRAVENOUS | Status: DC
  Administered 2012-03-25: 21:00:00 via INTRAVENOUS
  Administered 2012-03-26: 150 ug via INTRAVENOUS
  Administered 2012-03-26: 45 ug via INTRAVENOUS
  Administered 2012-03-26: 75 ug via INTRAVENOUS
  Administered 2012-03-26: 135 ug via INTRAVENOUS
  Administered 2012-03-26: 195 ug via INTRAVENOUS
  Administered 2012-03-26: 23:00:00 via INTRAVENOUS
  Administered 2012-03-26: 165 ug via INTRAVENOUS
  Administered 2012-03-27: 75 ug via INTRAVENOUS
  Administered 2012-03-27: 300 ug via INTRAVENOUS
  Administered 2012-03-27: 30 ug via INTRAVENOUS
  Administered 2012-03-27: 120 ug via INTRAVENOUS
  Administered 2012-03-27: 330 ug via INTRAVENOUS
  Administered 2012-03-27: 90 ug via INTRAVENOUS
  Administered 2012-03-28: 15 ug via INTRAVENOUS
  Administered 2012-03-28: 45 ug via INTRAVENOUS
  Administered 2012-03-28: 15 ug via INTRAVENOUS
  Administered 2012-03-29: 0.1 ug via INTRAVENOUS
  Administered 2012-03-29: 15 ug via INTRAVENOUS
  Administered 2012-03-30: 30 ug via INTRAVENOUS
  Administered 2012-03-30: 0.1 ug via INTRAVENOUS
  Filled 2012-03-25 (×5): qty 50

## 2012-03-25 MED ORDER — POTASSIUM CHLORIDE 10 MEQ/50ML IV SOLN
10.0000 meq | Freq: Every day | INTRAVENOUS | Status: DC | PRN
  Administered 2012-03-28: 10 meq via INTRAVENOUS
  Filled 2012-03-25: qty 50
  Filled 2012-03-25: qty 100

## 2012-03-25 MED ORDER — ONDANSETRON HCL 4 MG/2ML IJ SOLN
INTRAMUSCULAR | Status: AC
  Filled 2012-03-25: qty 2

## 2012-03-25 MED ORDER — FENTANYL CITRATE 0.05 MG/ML IJ SOLN
INTRAMUSCULAR | Status: DC | PRN
  Administered 2012-03-25: 100 ug via INTRAVENOUS
  Administered 2012-03-25: 400 ug via INTRAVENOUS
  Administered 2012-03-25: 50 ug via INTRAVENOUS
  Administered 2012-03-25: 100 ug via INTRAVENOUS

## 2012-03-25 MED ORDER — DIPHENHYDRAMINE HCL 12.5 MG/5ML PO ELIX
12.5000 mg | ORAL_SOLUTION | Freq: Four times a day (QID) | ORAL | Status: DC | PRN
  Filled 2012-03-25: qty 5

## 2012-03-25 MED ORDER — ONDANSETRON HCL 4 MG/2ML IJ SOLN
4.0000 mg | Freq: Four times a day (QID) | INTRAMUSCULAR | Status: DC | PRN
  Administered 2012-03-26 (×3): 4 mg via INTRAVENOUS
  Filled 2012-03-25: qty 2

## 2012-03-25 MED ORDER — SENNOSIDES-DOCUSATE SODIUM 8.6-50 MG PO TABS: 1.0000 | ORAL_TABLET | Freq: Every evening | ORAL | Status: DC | PRN

## 2012-03-25 MED ORDER — PROPOFOL 10 MG/ML IV BOLUS
INTRAVENOUS | Status: DC | PRN
  Administered 2012-03-25: 200 mg via INTRAVENOUS

## 2012-03-25 MED ORDER — ALPRAZOLAM 0.5 MG PO TABS
0.5000 mg | ORAL_TABLET | Freq: Three times a day (TID) | ORAL | Status: DC | PRN
  Administered 2012-03-26 – 2012-03-29 (×2): 0.5 mg via ORAL
  Filled 2012-03-25 (×2): qty 1

## 2012-03-25 MED ORDER — DEXTROSE 5 % IV SOLN
INTRAVENOUS | Status: AC
  Filled 2012-03-25: qty 50

## 2012-03-25 MED ORDER — BUPIVACAINE HCL (PF) 0.5 % IJ SOLN
INTRAMUSCULAR | Status: AC
  Filled 2012-03-25: qty 30

## 2012-03-25 MED ORDER — CEFUROXIME SODIUM 1.5 G IJ SOLR
1.5000 g | Freq: Two times a day (BID) | INTRAMUSCULAR | Status: AC
  Administered 2012-03-26 (×2): 1.5 g via INTRAVENOUS
  Filled 2012-03-25 (×2): qty 1.5

## 2012-03-25 MED ORDER — LACTATED RINGERS IV SOLN
INTRAVENOUS | Status: DC | PRN
  Administered 2012-03-25 (×2): via INTRAVENOUS

## 2012-03-25 MED ORDER — LIDOCAINE HCL (CARDIAC) 20 MG/ML IV SOLN
INTRAVENOUS | Status: DC | PRN
  Administered 2012-03-25: 100 mg via INTRAVENOUS

## 2012-03-25 MED ORDER — DIPHENHYDRAMINE HCL 50 MG/ML IJ SOLN
12.5000 mg | Freq: Four times a day (QID) | INTRAMUSCULAR | Status: DC | PRN
  Administered 2012-03-26: 12.5 mg via INTRAVENOUS
  Filled 2012-03-25: qty 1

## 2012-03-25 MED ORDER — ALBUMIN HUMAN 5 % IV SOLN
INTRAVENOUS | Status: DC | PRN
  Administered 2012-03-25 (×2): via INTRAVENOUS

## 2012-03-25 MED ORDER — VECURONIUM BROMIDE 10 MG IV SOLR
INTRAVENOUS | Status: DC | PRN
  Administered 2012-03-25: 3 mg via INTRAVENOUS
  Administered 2012-03-25: 1 mg via INTRAVENOUS
  Administered 2012-03-25: 2 mg via INTRAVENOUS
  Administered 2012-03-25 (×3): 1 mg via INTRAVENOUS
  Administered 2012-03-25: 2 mg via INTRAVENOUS
  Administered 2012-03-25 (×2): 1 mg via INTRAVENOUS

## 2012-03-25 MED ORDER — 0.9 % SODIUM CHLORIDE (POUR BTL) OPTIME
TOPICAL | Status: DC | PRN
  Administered 2012-03-25: 2000 mL

## 2012-03-25 MED ORDER — ROCURONIUM BROMIDE 100 MG/10ML IV SOLN
INTRAVENOUS | Status: DC | PRN
  Administered 2012-03-25: 50 mg via INTRAVENOUS

## 2012-03-25 MED ORDER — BUPIVACAINE ON-Q PAIN PUMP (FOR ORDER SET NO CHG)
INJECTION | Status: AC
  Filled 2012-03-25: qty 1

## 2012-03-25 MED ORDER — HYDROMORPHONE HCL PF 1 MG/ML IJ SOLN
INTRAMUSCULAR | Status: AC
  Administered 2012-03-25: 0.5 mg via INTRAVENOUS
  Filled 2012-03-25: qty 1

## 2012-03-25 MED ORDER — ALBUTEROL SULFATE HFA 108 (90 BASE) MCG/ACT IN AERS
2.0000 | INHALATION_SPRAY | Freq: Four times a day (QID) | RESPIRATORY_TRACT | Status: AC
  Administered 2012-03-26 – 2012-03-27 (×6): 2 via RESPIRATORY_TRACT
  Filled 2012-03-25: qty 6.7

## 2012-03-25 MED ORDER — MIDAZOLAM HCL 5 MG/5ML IJ SOLN
INTRAMUSCULAR | Status: DC | PRN
  Administered 2012-03-25: 2 mg via INTRAVENOUS

## 2012-03-25 MED ORDER — KCL IN DEXTROSE-NACL 20-5-0.45 MEQ/L-%-% IV SOLN
INTRAVENOUS | Status: DC
  Administered 2012-03-26 (×2): via INTRAVENOUS
  Filled 2012-03-25 (×4): qty 1000

## 2012-03-25 MED ORDER — OXYCODONE HCL 5 MG PO TABS: 5.0000 mg | ORAL_TABLET | ORAL | Status: AC | PRN

## 2012-03-25 MED ORDER — ONDANSETRON HCL 4 MG/2ML IJ SOLN
4.0000 mg | Freq: Four times a day (QID) | INTRAMUSCULAR | Status: DC | PRN
  Filled 2012-03-25 (×2): qty 2

## 2012-03-25 MED ORDER — ACETAMINOPHEN 10 MG/ML IV SOLN: 1000.0000 mg | Freq: Once | INTRAVENOUS | Status: DC | PRN

## 2012-03-25 MED ORDER — DEXAMETHASONE SODIUM PHOSPHATE 4 MG/ML IJ SOLN
INTRAMUSCULAR | Status: DC | PRN
  Administered 2012-03-25: 4 mg via INTRAVENOUS

## 2012-03-25 MED ORDER — NEBIVOLOL HCL 5 MG PO TABS
5.0000 mg | ORAL_TABLET | Freq: Every day | ORAL | Status: DC
  Administered 2012-03-26 – 2012-03-30 (×5): 5 mg via ORAL
  Filled 2012-03-25 (×5): qty 1

## 2012-03-25 MED ORDER — GLYCOPYRROLATE 0.2 MG/ML IJ SOLN
INTRAMUSCULAR | Status: DC | PRN
  Administered 2012-03-25: 0.6 mg via INTRAVENOUS

## 2012-03-25 MED ORDER — ONDANSETRON HCL 4 MG/2ML IJ SOLN
INTRAMUSCULAR | Status: DC | PRN
  Administered 2012-03-25: 4 mg via INTRAVENOUS

## 2012-03-25 MED ORDER — OXYCODONE-ACETAMINOPHEN 5-325 MG PO TABS
1.0000 | ORAL_TABLET | ORAL | Status: DC | PRN
  Administered 2012-03-26 – 2012-03-27 (×4): 2 via ORAL
  Administered 2012-03-28 (×2): 1 via ORAL
  Administered 2012-03-28 (×4): 2 via ORAL
  Administered 2012-03-29 – 2012-03-30 (×5): 1 via ORAL
  Filled 2012-03-25: qty 1
  Filled 2012-03-25: qty 2
  Filled 2012-03-25 (×2): qty 1
  Filled 2012-03-25: qty 2
  Filled 2012-03-25: qty 1
  Filled 2012-03-25 (×4): qty 2
  Filled 2012-03-25: qty 1
  Filled 2012-03-25 (×3): qty 2

## 2012-03-25 MED ORDER — FAMOTIDINE 20 MG PO TABS
20.0000 mg | ORAL_TABLET | Freq: Every day | ORAL | Status: DC
  Administered 2012-03-25: 20 mg via ORAL
  Filled 2012-03-25 (×3): qty 1

## 2012-03-25 MED ORDER — BUPIVACAINE 0.5 % ON-Q PUMP SINGLE CATH 400 ML
INJECTION | Status: DC | PRN
  Administered 2012-03-25: 400 mL

## 2012-03-25 MED ORDER — BUPIVACAINE 0.5 % ON-Q PUMP SINGLE CATH 400 ML
400.0000 mL | INJECTION | Status: DC
  Filled 2012-03-25: qty 400

## 2012-03-25 MED ORDER — TRAMADOL HCL 50 MG PO TABS
50.0000 mg | ORAL_TABLET | Freq: Four times a day (QID) | ORAL | Status: DC | PRN
  Administered 2012-03-26 – 2012-03-29 (×4): 100 mg via ORAL
  Filled 2012-03-25 (×4): qty 2

## 2012-03-25 MED ORDER — NALOXONE HCL 0.4 MG/ML IJ SOLN: 0.4000 mg | INTRAMUSCULAR | Status: DC | PRN

## 2012-03-25 MED ORDER — LACTATED RINGERS IV SOLN
INTRAVENOUS | Status: DC | PRN
  Administered 2012-03-25 (×2): via INTRAVENOUS

## 2012-03-25 MED ORDER — HYDROMORPHONE HCL PF 1 MG/ML IJ SOLN
0.2500 mg | INTRAMUSCULAR | Status: DC | PRN
  Administered 2012-03-25 (×4): 0.5 mg via INTRAVENOUS

## 2012-03-25 MED ORDER — ONDANSETRON HCL 4 MG/2ML IJ SOLN
4.0000 mg | Freq: Once | INTRAMUSCULAR | Status: AC | PRN
  Administered 2012-03-25: 4 mg via INTRAVENOUS

## 2012-03-25 SURGICAL SUPPLY — 77 items
APPLICATOR TIP COSEAL (VASCULAR PRODUCTS) IMPLANT
APPLICATOR TIP EXT COSEAL (VASCULAR PRODUCTS) IMPLANT
BRUSH CYTOL CELLEBRITY 1.5X140 (MISCELLANEOUS) IMPLANT
CANISTER SUCTION 2500CC (MISCELLANEOUS) ×3 IMPLANT
CATH KIT ON Q 5IN SLV (PAIN MANAGEMENT) ×3 IMPLANT
CATH THORACIC 28FR (CATHETERS) ×6 IMPLANT
CATH THORACIC 36FR (CATHETERS) IMPLANT
CATH THORACIC 36FR RT ANG (CATHETERS) IMPLANT
CLIP TI MEDIUM 24 (CLIP) ×3 IMPLANT
CLIP TI MEDIUM 6 (CLIP) ×3 IMPLANT
CLOTH BEACON ORANGE TIMEOUT ST (SAFETY) ×3 IMPLANT
CONT SPEC 4OZ CLIKSEAL STRL BL (MISCELLANEOUS) ×12 IMPLANT
COVER TABLE BACK 60X90 (DRAPES) ×3 IMPLANT
DRAPE LAPAROSCOPIC ABDOMINAL (DRAPES) ×3 IMPLANT
DRAPE WARM FLUID 44X44 (DRAPE) ×3 IMPLANT
DRILL BIT 7/64X5 (BIT) IMPLANT
ELECT REM PT RETURN 9FT ADLT (ELECTROSURGICAL) ×3
ELECTRODE REM PT RTRN 9FT ADLT (ELECTROSURGICAL) ×2 IMPLANT
FORCEPS BIOP RJ4 1.8 (CUTTING FORCEPS) IMPLANT
GLOVE BIO SURGEON STRL SZ 6.5 (GLOVE) ×12 IMPLANT
GLOVE BIOGEL PI IND STRL 6.5 (GLOVE) ×8 IMPLANT
GLOVE BIOGEL PI INDICATOR 6.5 (GLOVE) ×4
GLOVE ECLIPSE 6.5 STRL STRAW (GLOVE) ×3 IMPLANT
GOWN PREVENTION PLUS XLARGE (GOWN DISPOSABLE) ×3 IMPLANT
GOWN STRL NON-REIN LRG LVL3 (GOWN DISPOSABLE) ×21 IMPLANT
HANDLE STAPLE ENDO GIA SHORT (STAPLE) ×1
KIT BASIN OR (CUSTOM PROCEDURE TRAY) ×3 IMPLANT
KIT ROOM TURNOVER OR (KITS) ×3 IMPLANT
KIT SUCTION CATH 14FR (SUCTIONS) ×3 IMPLANT
MARKER SKIN DUAL TIP RULER LAB (MISCELLANEOUS) ×3 IMPLANT
NEEDLE BIOPSY TRANSBRONCH 21G (NEEDLE) IMPLANT
NS IRRIG 1000ML POUR BTL (IV SOLUTION) ×6 IMPLANT
OIL SILICONE PENTAX (PARTS (SERVICE/REPAIRS)) ×3 IMPLANT
PACK CHEST (CUSTOM PROCEDURE TRAY) ×3 IMPLANT
PAD ARMBOARD 7.5X6 YLW CONV (MISCELLANEOUS) ×6 IMPLANT
RELOAD EGIA 45 MED/THCK PURPLE (STAPLE) ×15 IMPLANT
RELOAD EGIA 45 TAN VASC (STAPLE) ×3 IMPLANT
RELOAD EGIA BLACK ROTIC 45MM (STAPLE) ×6 IMPLANT
RELOAD EGIA TRIS TAN 45 CVD (STAPLE) ×18 IMPLANT
SCISSORS LAP 5X35 DISP (ENDOMECHANICALS) IMPLANT
SEALANT PROGEL (MISCELLANEOUS) IMPLANT
SEALANT SURG COSEAL 4ML (VASCULAR PRODUCTS) IMPLANT
SEALANT SURG COSEAL 8ML (VASCULAR PRODUCTS) IMPLANT
SOLUTION ANTI FOG 6CC (MISCELLANEOUS) ×3 IMPLANT
SPONGE GAUZE 4X4 12PLY (GAUZE/BANDAGES/DRESSINGS) ×3 IMPLANT
STAPLER ENDO GIA 12MM SHORT (STAPLE) ×2 IMPLANT
STAPLER TA30 4.8 NON-ABS (STAPLE) ×3 IMPLANT
SUT PROLENE 3 0 SH DA (SUTURE) IMPLANT
SUT PROLENE 4 0 RB 1 (SUTURE)
SUT PROLENE 4-0 RB1 .5 CRCL 36 (SUTURE) IMPLANT
SUT SILK  1 MH (SUTURE) ×2
SUT SILK 1 MH (SUTURE) ×4 IMPLANT
SUT SILK 2 0SH CR/8 30 (SUTURE) ×3 IMPLANT
SUT SILK 3 0SH CR/8 30 (SUTURE) IMPLANT
SUT STEEL 1 (SUTURE) IMPLANT
SUT VIC AB 1 CTX 18 (SUTURE) ×6 IMPLANT
SUT VIC AB 1 CTX 36 (SUTURE)
SUT VIC AB 1 CTX36XBRD ANBCTR (SUTURE) IMPLANT
SUT VIC AB 2-0 CTX 36 (SUTURE) ×3 IMPLANT
SUT VIC AB 2-0 UR6 27 (SUTURE) ×3 IMPLANT
SUT VIC AB 3-0 SH 8-18 (SUTURE) IMPLANT
SUT VIC AB 3-0 X1 27 (SUTURE) ×6 IMPLANT
SUT VICRYL 2 TP 1 (SUTURE) ×6 IMPLANT
SWAB COLLECTION DEVICE MRSA (MISCELLANEOUS) IMPLANT
SYR 20ML ECCENTRIC (SYRINGE) ×3 IMPLANT
SYSTEM SAHARA CHEST DRAIN ATS (WOUND CARE) ×3 IMPLANT
TAPE CLOTH SURG 4X10 WHT LF (GAUZE/BANDAGES/DRESSINGS) ×3 IMPLANT
TAPE UMBILICAL COTTON 1/8X30 (MISCELLANEOUS) ×3 IMPLANT
TIP APPLICATOR SPRAY EXTEND 16 (VASCULAR PRODUCTS) IMPLANT
TOWEL OR 17X24 6PK STRL BLUE (TOWEL DISPOSABLE) ×6 IMPLANT
TOWEL OR 17X26 10 PK STRL BLUE (TOWEL DISPOSABLE) ×6 IMPLANT
TRAP SPECIMEN MUCOUS 40CC (MISCELLANEOUS) ×6 IMPLANT
TRAY FOLEY CATH 14FRSI W/METER (CATHETERS) ×3 IMPLANT
TUBE ANAEROBIC SPECIMEN COL (MISCELLANEOUS) IMPLANT
TUBE CONNECTING 12X1/4 (SUCTIONS) ×3 IMPLANT
WATER STERILE IRR 1000ML POUR (IV SOLUTION) ×6 IMPLANT
YANKAUER SUCT BULB TIP NO VENT (SUCTIONS) ×3 IMPLANT

## 2012-03-25 NOTE — Transfer of Care (Signed)
Immediate Anesthesia Transfer of Care Note  Patient: [name] [name]  Procedure(s) Performed: Procedure(s) (LRB) with comments: VIDEO BRONCHOSCOPY (N/A) VIDEO ASSISTED THORACOSCOPY (VATS)/ LOBECTOMY (Left) - Left upper lobe lobectomy.  Lingulectomy.  Patient Location: PACU  Anesthesia Type:General  Level of Consciousness: sedated  Airway & Oxygen Therapy: Patient Spontanous Breathing and Patient connected to face mask oxygen  Post-op Assessment: Report given to PACU RN and Post -op Vital signs reviewed and stable  Post vital signs: Reviewed and stable  Complications: No apparent anesthesia complications

## 2012-03-25 NOTE — OR Nursing (Signed)
First procedure (Bronchoscopy) ended at 1350. Second procedure (Thoracoscopy and lung resection) started at 1409.

## 2012-03-25 NOTE — Progress Notes (Signed)
PCXR done

## 2012-03-25 NOTE — Anesthesia Preprocedure Evaluation (Signed)
Anesthesia Evaluation   Patient awake and Patient confused    Reviewed: Allergy & Precautions, H&P , NPO status , Patient's Chart, lab work & pertinent test results  Airway Mallampati: II      Dental  (+) Teeth Intact and Dental Advisory Given   Pulmonary  breath sounds clear to auscultation        Cardiovascular Rhythm:Regular Rate:Normal     Neuro/Psych    GI/Hepatic   Endo/Other    Renal/GU      Musculoskeletal   Abdominal (+)  Abdomen: soft.    Peds  Hematology   Anesthesia Other Findings   Reproductive/Obstetrics                           Anesthesia Physical Anesthesia Plan  ASA: III  Anesthesia Plan: General   Post-op Pain Management:    Induction: Intravenous  Airway Management Planned: Double Lumen EBT  Additional Equipment: CVP and Arterial line  Intra-op Plan:   Post-operative Plan: Extubation in OR  Informed Consent: I have reviewed the patients History and Physical, chart, labs and discussed the procedure including the risks, benefits and alternatives for the proposed anesthesia with the patient or authorized representative who has indicated his/her understanding and acceptance.   Dental advisory given  Plan Discussed with: CRNA and Surgeon  Anesthesia Plan Comments: (L. 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

## 2012-03-25 NOTE — H&P (Signed)
[address]            [place] [address]          [phone]       [name] [name] [facility] Medical Record #[id number] Date of Birth: 14-Jul-1960  Referring: Nyoka Cowden, MD Primary Care: Nonnie Done., MD  Chief Complaint:    Chief Complaint  Patient presents with  . Lung Mass    Referral from Dr Sherene Sires for surgical eval on Left upper lobe pulmonary nodule, PET Scan 03/19/12, Chest CT 03/07/12    History of Present Illness:    Patient is a 51 year old nonsmoker who presented with shortness of breath and chest discomfort 3 years ago after having ankle surgery. There was a concern of pulmonary embolus at that time a CT scan was done, she had no pulmonary embolus but did have a left lung nodule. There was no note of this in the report on the CT scan and no follow up. She returns last month with similar symptoms underwent CT scan and ultimately a Cardiolite stress test and cardiac catheterization. She had no evidence of coronary artery disease, however the repeat CT scan showed the same left lung nodule which had increased in size. Further workup including PET scan was performed and the patient was referred to thoracic surgery.       Current Activity/ Functional Status: Patient is independent with mobility/ambulation, transfers, ADL's, IADL's.   Past Medical History  Diagnosis Date  . Anxiety   . Obesity   . Hypertension     takes Amlodipine and Bystolic daily  . Asthma     since childhood-uses albuterol prn  . Shortness of breath     with exertion  . Seizures     as a child d/t hit to head;and about 4-37yrs ago came back and then  came back;last one 2+yrs ago  . Concussion     as a child  . Joint pain     d/t right leg break  . Eczema     on hands   . GERD (gastroesophageal reflux disease)     takes Protonix-started 03/20/12 and Pepcid   . Urinary frequency   . History of kidney stones   . Nocturia   . Urinary urgency   . Anemia       hx of-controlled by hysterectomy  . Panic attacks     takes Xanax daily  . Insomnia     takes Benadryl nightly  . PONV (postoperative nausea and vomiting)     b/p low and slow to come off of o2    Past Surgical History  Procedure Date  . Orif ankle fracture 9 months ago    Rt ankle  . Abdominal hysterectomy   . Total abdominal hysterectomy w/ bilateral salpingoophorectomy   . Breast enhancement surgery with implants 15 years ago   . Cardiac catheterization 2013  . Cyst removed from ovaries 12yr ago  . Tubal ligation     Family History  Problem Relation Age of Onset  . Heart failure Mother   . Heart failure Father   . Heart failure Brother   . COPD Mother   . COPD Father   . COPD Sister Died after lung transplant at [facility] age 21  . Hypertension Brother   . Hypercholesterolemia Brother   . Stroke Mother     History   Social  History  . Marital Status: Married    Spouse Name: N/A    Number of Children: 3  . Years of Education: N/A   Occupational History  . nurse     case Production designer, theatre/television/film at [employer], retirement community   Social History Main Topics  . Smoking status: Never Smoker   . Smokeless tobacco: Never Used  . Alcohol Use: 0.6 oz/week    1 Glasses of wine per week     Comment: Occasionally  . Drug Use: No  . Sexually Active: Yes    Birth Control/ Protection: Surgical                 History  Smoking status  . Never Smoker   Smokeless tobacco  . Never Used    History  Alcohol Use  . 0.6 oz/week  . 1 Glasses of wine per week    Comment: Occasionally     Allergies  Allergen Reactions  . Dilantin (Phenytoin Sodium Extended) Other (See Comments)    Reaction unknown, from Childhood   . Keppra (Levetiracetam) Other (See Comments)     Paralysis of lt hand/arm  . Phenobarbital Other (See Comments)    Reaction unknown, from childhood  . Vimpat (Lacosamide) Other (See Comments)    Severe confusion    Current  Facility-Administered Medications  Medication Dose Route Frequency Provider Last Rate Last Dose  . cefUROXime (ZINACEF) 1.5 g in dextrose 5 % 50 mL IVPB  1.5 g Intravenous 60 min Pre-Op Delight Ovens, MD           Review of Systems:     Cardiac Review of Systems: Y or N  Chest Pain [  y  ]  Resting SOB [ n  ] Exertional SOB  [ y ]  Orthopnea [  ]   Pedal Edema [   ]    Palpitations [  ] Syncope  [  ]   Presyncope [   ]  General Review of Systems: [Y] = yes [  ]=no Constitional: recent weight change [  ]; anorexia [  ]; fatigue [  ]; nausea [  ]; night sweats [  ]; fever [ n ]; or chills [ n ];                                                                                                                                          Dental: poor dentition[ n ]; Last Dentist visit:   Eye : blurred vision [  ]; diplopia [   ]; vision changes [  ];  Amaurosis fugax[  ]; Resp: cough [  ];  wheezing[  ];  hemoptysis[  ]; shortness of breath[  ]; paroxysmal nocturnal dyspnea[  ]; dyspnea on exertion[  ]; or orthopnea[  ];  GI:  gallstones[  ], vomiting[  ];  dysphagia[  ]; melena[  ];  hematochezia [  ]; heartburn[  ];   Hx of  Colonoscopy[y  ]; GU: kidney stones [  ]; hematuria[  ];   dysuria [  ];  nocturia[  ];  history of     obstruction [  ];             Skin: rash, swelling[  ];, hair loss[  ];  peripheral edema[  ];  or itching[  ]; Musculosketetal: myalgias[  ];  joint swelling[  ];  joint erythema[  ];  joint pain[  ];  back pain[  ];  Heme/Lymph: bruising[  ];  bleeding[  ];  anemia[  ];  Neuro: TIA[  ];  headaches[  ];  stroke[  ];  vertigo[  ];  seizures[  ];   paresthesias[  ];  difficulty walking[  ];  Psych:depression[  ]; anxiety[  ];  Endocrine: diabetes[  ];  thyroid dysfunction[  ];  Immunizations: Flu [ y ]; Pneumococcal[ n ];  Other:  Physical Exam: BP 112/70  Pulse 72  Temp 97.9 F (36.6 C) (Oral)  Resp 16  SpO2 98%  General appearance: alert, cooperative and  appears stated age Neurologic: intact Heart: regular rate and rhythm, S1, S2 normal, no murmur, click, rub or gallop and normal apical impulse Lungs: clear to auscultation bilaterally and normal percussion bilaterally Abdomen: soft, non-tender; bowel sounds normal; no masses,  no organomegaly Extremities: extremities normal, atraumatic, no cyanosis or edema and Homans sign is negative, no sign of DVT   Diagnostic Studies & Laboratory data:     Recent Radiology Findings:   No results found. Ct Angio Chest Pe W/cm &/or Wo Cm  03/07/2012  *RADIOLOGY REPORT*  Clinical Data: Chest pain.  Elevated D-dimer.  CT ANGIOGRAPHY CHEST  Technique:  Multidetector CT imaging of the chest using the standard protocol during bolus administration of intravenous contrast. Multiplanar reconstructed images including MIPs were obtained and reviewed to evaluate the vascular anatomy.  Contrast: OMNIPAQUE IOHEXOL 350 MG/ML SOLN  Comparison: CT chest 07/29/2008.  Plain film of the chest 03/06/2012 and PA and lateral chest 05/30/2010.  Findings: No pulmonary embolus is identified.  Bilateral breast implants are noted.  Heart size is mildly enlarged.  No pleural effusion is identified.  A lobulated nodule measuring 1.8 x 2.2 x 2.0 cm is identified in the left upper lobe suspicious for bronchogenic carcinoma. In retrospect, this nodule is present on the comparison study where it measured 1.8 x 1.3 x 1.6 cm.  Also seen is some dependent atelectasis.  The lung bases are not included on the study. Visualized liver parenchyma is unremarkable.  IMPRESSION:  1.  Negative for pulmonary embolus. 2.  Left upper lobe pulmonary nodule is slowly increasing in size since 2010 and is worrisome for bronchogenic carcinoma.  Critical Value/emergent results were called by telephone at the time of interpretation on 03/07/2012 at 1:40 p.m. to Hetty Ely, who verbally acknowledged these results.   Original Report Authenticated By: Bernadene Bell.  Maricela Curet, M.D.    Nm Myocar Multi W/spect W/wall Motion / Ef  03/08/2012  *RADIOLOGY REPORT*  Clinical data: Chest pain, asthma.  NUCLEAR MEDICINE MYOCARDIAL PERFUSION IMAGING NUCLEAR MEDICINE LEFT VENTRICULAR WALL MOTION ANALYSIS NUCLEAR MEDICINE LEFT VENTRICULAR EJECTION FRACTION CALCULATION  Technique: Standard single day myocardial SPECT imaging was performed after resting intravenous injection of Tc-21m Myoview. After intravenous infusion of Lexiscan (regadenoson) under supervision of cardiology staff, Myoview was injected intravenously and standard myocardial SPECT imaging was performed. Quantitative gated  imaging was also performed to evaluate left ventricular wall motion and estimate left ventricular ejection fraction.  Radiopharmaceutical: 10.2 +33 mCi Tc85m Myoview IV.  Comparison: None  Findings:    The stress SPECT images demonstrate mildly decreased apical and anteroapical activity, with otherwise physiologic distribution of radiopharmaceutical. Rest images demonstrate relatively improved anteroapical activity with a persistent apical perfusion defect, no new   perfusion defects. The gated stress SPECT images demonstrate normal left ventricular myocardial thickening.  No focal wall motion abnormality is seen. Calculated left ventricular end-diastolic volume 51ml, end-systolic volume 31ml, ejection fraction of 39%.  IMPRESSION  1. Fixed apical defect with a small area of adjacent anteroapical ischemia.  2. Left ventricular ejection fraction 39%.   Original Report Authenticated By: Osa Craver, M.D.    Nm Pet Image Initial (pi) Skull Base To Thigh  03/19/2012  *RADIOLOGY REPORT*  Clinical Data: Initial treatment strategy for enlarging left upper lobe pulmonary nodule. No history of malignancy.  NUCLEAR MEDICINE PET SKULL BASE TO THIGH  Fasting Blood Glucose:  94  Technique:  16.2 mCi F-18 FDG was injected intravenously. CT data was obtained and used for attenuation correction and  anatomic localization only.  (This was not acquired as a diagnostic CT examination.) Additional exam technical data entered on technologist worksheet.  Comparison:  Chest CTA 03/07/2012. Chest CT 07/29/2008.  Findings:  Neck: No hypermetabolic lymph nodes in the neck.  Chest:  The lingular nodule is very difficult to evaluate with PET CT given the close proximity to the left ventricular myocardium. The nodule measures 2.0 x 1.4 cm on image 89, unchanged from the recent chest CTA.  There is very prominent activity within the adjacent myocardium.  Mildly increased activity within the nodule cannot be excluded although does not appear intense on the three- dimensional images.  There is no hypermetabolic nodal activity.  Abdomen/Pelvis:  No abnormal hypermetabolic activity within the liver, pancreas, adrenal glands, or spleen.  No hypermetabolic lymph nodes in the abdomen or pelvis.  There is a possible tiny nonobstructing calculus in the lower pole of the left kidney.  Skeleton:  No focal hypermetabolic activity to suggest skeletal metastasis.  IMPRESSION:  1.  The enlarging lingular nodule is difficult to evaluate with PET CT due to the close proximity with the left ventricular myocardium which shows prominent physiologic activity. 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  This does appear to have an endobronchial component on the CT.  Bronchoscopic evaluation recommended. 2.  No evidence of metastatic disease.   Original Report Authenticated By: Carey Bullocks, M.D.    07/2008 CT: RADIOLOGY REPORT*  Clinical Data: Shortness of breath. Surgery 7 days ago.  CT ANGIOGRAPHY CHEST  Technique: Multidetector CT imaging of the chest was performed using the standard protocol during bolus administration of intravenous contrast. Multiplanar CT image reconstructions including MIPs were  obtained to evaluate the vascular anatomy.  Contrast: 80 ml Optiray 350  Comparison: Plain film chest 07/29/2008.  Findings: No pulmonary embolus is identified. There is no pleural or pericardial effusion. Heart size is normal. Bilateral breast implants noted. No hilar, axillary mediastinal lymphadenopathy. There is some linear atelectatic change in the right lung base. Lungs otherwise clear. Airway is unremarkable. Visualized upper abdomen is unremarkable. There is no focal bony abnormality.  Review of the MIP images confirms the above findings.  IMPRESSION: Negative for pulmonary embolus. Negative examination.  The left upper lobe lung mass is not mentioned in this report or was present on review of the CT from 2010 and has enlarged slightly from that time        Recent Lab Findings: Lab Results  Component Value Date   WBC 6.2 03/21/2012   HGB 14.5 03/21/2012   HCT 42.3 03/21/2012   PLT 306 03/21/2012   GLUCOSE 94 03/21/2012   ALT 16 03/21/2012   AST 15 03/21/2012   NA 140 03/21/2012   K 3.8 03/21/2012   CL 106 03/21/2012   CREATININE 0.68 03/21/2012   BUN 16 03/21/2012   CO2 20 03/21/2012   INR 1.01 03/21/2012      Assessment / Plan:     #1 slowly enlarging left upper lobe lung nodule along the left cardiac border, documented previously on CT scan in 2010 but now has enlarged slightly. Masses found incidentally on a CT scan to rule out pulmonary embolus, patient is also recently undergone cardiac catheterization without obvious cardiac disease.  I recommended to her proceeding with bronchoscopy left video-assisted thoracoscopy and lung resection and possible lobectomy for enlarging lung nodule.  The goals risks and alternatives of the planned surgical procedure bronch, left VATS, lung resection  have been discussed with the patient in detail. The risks of the procedure including death, infection, stroke, myocardial infarction, bleeding, blood transfusion have all been  discussed specifically.  I have quoted Hadassah Pais a 1 % of perioperative mortality and a complication rate as high as 15 %. The patient's questions have been answered.Valerya Dershem is willing  to proceed with the planned procedure.      Delight Ovens MD  Beeper [phone] Office [phone] 03/25/2012 1:06 PM

## 2012-03-25 NOTE — Preoperative (Signed)
Beta Blockers   Reason not to administer Beta Blockers:Not Applicable, took at 0700

## 2012-03-25 NOTE — Anesthesia Procedure Notes (Signed)
Procedure Name: Intubation Date/Time: 03/25/2012 1:35 PM Performed by: Marena Chancy Pre-anesthesia Checklist: Patient identified, Timeout performed, Emergency Drugs available, Suction available and Patient being monitored Patient Re-evaluated:Patient Re-evaluated prior to inductionOxygen Delivery Method: Circle system utilized Intubation Type: IV induction Ventilation: Mask ventilation without difficulty and Oral airway inserted - appropriate to patient size Laryngoscope Size: Mac and 3 Grade View: Grade II Tube type: Oral Endobronchial tube: Left, EBT position confirmed by fiberoptic bronchoscope and EBT position confirmed by auscultation and 35 Fr Number of attempts: 1 Placement Confirmation: ETT inserted through vocal cords under direct vision,  breath sounds checked- equal and bilateral and positive ETCO2 Secured at: 29 cm Tube secured with: Tape Dental Injury: Teeth and Oropharynx as per pre-operative assessment

## 2012-03-25 NOTE — Anesthesia Postprocedure Evaluation (Signed)
Anesthesia Post Note  Patient: [name] [name]  Procedure(s) Performed: Procedure(s) (LRB): VIDEO BRONCHOSCOPY (N/A) VIDEO ASSISTED THORACOSCOPY (VATS)/ LOBECTOMY (Left)  Anesthesia type: general  Patient location: PACU  Post pain: Pain level controlled  Post assessment: Patient's Cardiovascular Status Stable  Last Vitals:  Filed Vitals:   03/25/12 1945  BP: 142/97  Pulse: 89  Temp:   Resp: 25    Post vital signs: Reviewed and stable  Level of consciousness: sedated  Complications: No apparent anesthesia complications

## 2012-03-25 NOTE — Brief Op Note (Addendum)
03/25/2012  6:41 PM  PATIENT:  [name] [name]  51 y.o. 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

## 2012-03-26 ENCOUNTER — Encounter (HOSPITAL_COMMUNITY): Payer: Self-pay | Admitting: Cardiothoracic Surgery

## 2012-03-26 ENCOUNTER — Inpatient Hospital Stay (HOSPITAL_COMMUNITY): Payer: BC Managed Care – PPO

## 2012-03-26 LAB — BASIC METABOLIC PANEL
BUN: 7 mg/dL (ref 6–23)
GFR calc Af Amer: >90 mL/min (ref >90)
GFR calc non Af Amer: >90 mL/min (ref >90)
Potassium: 4 mEq/L (ref 3.5–5.1)
Sodium: 135 mEq/L (ref 135–145)

## 2012-03-26 LAB — BLOOD GAS, ARTERIAL
FIO2: 0.28 %
O2 Content: 2 L/min
pCO2 arterial: 46.2 mmHg — ABNORMAL HIGH (ref 35.0–45.0)
pH, Arterial: 7.328 — ABNORMAL LOW (ref 7.350–7.450)

## 2012-03-26 LAB — CBC
Hemoglobin: 12 g/dL (ref 12.0–15.0)
RBC: 4.3 MIL/uL (ref 3.87–5.11)

## 2012-03-26 MED ORDER — KCL IN DEXTROSE-NACL 20-5-0.45 MEQ/L-%-% IV SOLN
INTRAVENOUS | Status: DC
  Administered 2012-03-27: 01:00:00 via INTRAVENOUS
  Administered 2012-03-28: 20 mL/h via INTRAVENOUS
  Filled 2012-03-26 (×5): qty 1000

## 2012-03-26 MED ORDER — PROMETHAZINE HCL 25 MG/ML IJ SOLN
12.5000 mg | Freq: Four times a day (QID) | INTRAMUSCULAR | Status: DC | PRN
  Administered 2012-03-26 – 2012-03-27 (×2): 12.5 mg via INTRAVENOUS
  Filled 2012-03-26 (×2): qty 1

## 2012-03-26 MED ORDER — ENOXAPARIN SODIUM 30 MG/0.3ML ~~LOC~~ SOLN
30.0000 mg | SUBCUTANEOUS | Status: DC
  Administered 2012-03-26 – 2012-03-29 (×4): 30 mg via SUBCUTANEOUS
  Filled 2012-03-26 (×5): qty 0.3

## 2012-03-26 NOTE — Op Note (Signed)
[name], [name] NO.:  [id number]  MEDICAL RECORD NO.:  [id number]  LOCATION:  2305                         FACILITY:  [facility]  PHYSICIAN:  Sheliah Plane, MD    DATE OF BIRTH:  1960-07-21  DATE OF PROCEDURE:  03/25/2012 DATE OF DISCHARGE:                              OPERATIVE REPORT   PREOPERATIVE DIAGNOSIS:  Left upper lobe lung mass, slowly expanding.  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  PROCEDURES PERFORMED:  Bronchoscopy, left video-assisted thoracoscopy, minithoracotomy, lingulectomy, left upper lobectomy with lymph node dissection.  SURGEON:  Sheliah Plane, MD  FIRST ASSISTANT:  Coral Ceo, P.A.  BRIEF HISTORY:  The patient is a 51 year old female who incidentally was found on recent CT scan to have a 2-cm mass on CT in the left upper lobe along the left heart border.  In retrospect, this mass was present on a CT scan.  Scan done at [facility] 3 years previously, but had increased in size.  The patient is a nonsmoker.  The patient is referred from Pulmonology to Thoracic Surgery for consideration of diagnosis of treatment.  PET scan had been performed with no evidence of mediastinal uptake of tracer.  Surgical resection was recommended to the patient who agreed and signed informed consent.  DESCRIPTION OF PROCEDURE:  With central line and arterial line in place, the patient underwent general endotracheal anesthesia without incident. Through the endotracheal tube, a fiberoptic bronchoscope was passed. There were no endobronchial lesions in the right lung.  The left mainstem bronchus, upper lobe bronchus appeared normal.  There was a suggestion of bronchial narrowing in the lingular branch of the left lung.  Clinical suspicion was that the mass was carcinoid.  The scope was then removed.  A port incision was made for a VATS after the patient had been turned in lateral decubitus position with left side up and the chest was  prepped and draped with Betadine sterile manner.  The double- lumen endotracheal tube about deflation of the left lung, 30-degree scope was introduced into left chest.  There were no pleural base masses, although the lesion was presumably in the left upper lobe and lingula, this was difficult to visualize.  Additional port incisions were made slightly anterior posterior to which grabbers were past.  A utilitarian incision was made to assist in confirming the location of the tumor with palpation.  The patient had a well-developed fissure and we initially proceeded with a lingulectomy.  The arterial branch to the lingula was identified, encircled and stapled with a vascular stapler as was the venous drainage.  This left a bronchus to the lingula, which was identified and the bronchus to the remaining upper lobe.  With the black staple, this bronchus was divided.  Using staplers, the long parenchyma was then divided and specimen removed and submitted to Pathology.  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  However, the bronchial margin was positive.  With this, we proceeded to do a cold left upper lobectomy.  The remaining pulmonary artery branches were divided with a vascular stapler as was vein. A black stapler was then used to divide the bronchus to the left upper  lobe.  Prior to dividing this, the lung was inflated and confirming full inflation of the lower lobe.  The remaining soft tissue was dissected free and the specimen was submitted.  The bronchial margin after this full upper lobectomy showed was negative for carcinoid.  Multiple nodes at T11 and T12 areas were all submitted were removed and submitted to Pathology also.  With the operative team with field hemostatic, the left lower lobe fully inflated without evidence of bronchial leak.  Two 28 chest tubes were left in place.  The utilitarian incision was closed with 0 Vicryl through small holes, drilled and  the ribs were used to stabilize the incision further. The muscle layers were closed with interrupted 0 Vicryl, running 2-0 Vicryl, subcutaneous tissue, 3-0 subcuticular stitch in skin edges.  The two 28 chest tubes were secured in place, which were placed through the previous port sites.  The one remaining port site was closed.  Prior to closure of the chest, an On-Q device was tunneled subcutaneously into the chest and then passed subpleurally and secured in place.  The patient met no air leak at the completion of the procedure.  She was awakened and extubated in the operating room and transferred to the recovery room for further postoperative care.  Blood loss was approximately 150 mL.  Sponge and needle count was reported as correct at completion of the procedure.     Sheliah Plane, MD     EG/MEDQ  D:  03/26/2012  T:  03/26/2012  Job:  [id number]

## 2012-03-26 NOTE — Progress Notes (Signed)
Utilization Review Completed.[name] [name] T11/13/2013   

## 2012-03-26 NOTE — Progress Notes (Signed)
TCTS DAILY PROGRESS NOTE                   [address]            [place] [address]          [phone]      1 Day Post-Op Procedure(s) (LRB): VIDEO BRONCHOSCOPY (N/A) VIDEO ASSISTED THORACOSCOPY (VATS)/ LOBECTOMY (Left)  Total Length of Stay:  LOS: 1 day   Subjective: Nausea all night , mild pain  Objective: Vital signs in last 24 hours: Temp:  [97.1 F (36.2 C)-98.8 F (37.1 C)] 98.4 F (36.9 C) (11/13 0800) Pulse Rate:  [72-101] 75  (11/13 0700) Cardiac Rhythm:  [-] Normal sinus rhythm (11/12 2230) Resp:  [9-43] 11  (11/13 0700) BP: (112-142)/(70-97) 142/97 mmHg (11/12 1945) SpO2:  [91 %-100 %] 95 % (11/13 0830) Arterial Line BP: (108-170)/(57-109) 133/68 mmHg (11/13 0700) Weight:  [191 lb 2.2 oz (86.7 kg)] 191 lb 2.2 oz (86.7 kg) (11/13 0600)  Filed Weights   03/26/12 0600  Weight: 191 lb 2.2 oz (86.7 kg)    Weight change:    Hemodynamic parameters for last 24 hours:    Intake/Output from previous day: 11/12 0701 - 11/13 0700 In: 3888 [I.V.:3136; IV Piggyback:752] Out: 2140 [Urine:1540; Blood:350; Chest Tube:250]  Intake/Output this shift:    Current Meds: Scheduled Meds:   . acetaminophen  1,000 mg Intravenous Q6H  . albuterol  2 puff Inhalation Q6H  . amLODipine  10 mg Oral Daily  . bisacodyl  10 mg Oral Daily  . [COMPLETED] cefUROXime (ZINACEF)  IV  1.5 g Intravenous 60 min Pre-Op  . cefUROXime (ZINACEF)  IV  1.5 g Intravenous Q12H  . famotidine  20 mg Oral QHS  . fentaNYL   Intravenous Q4H  . nebivolol  5 mg Oral Daily  . ondansetron      . pantoprazole  40 mg Oral Daily  . [DISCONTINUED] bupivacaine 0.5 % ON-Q pump SINGLE CATH 400 mL  400 mL Other To OR   Continuous Infusions:   . bupivacaine ON-Q pain pump    . dextrose 5 % and 0.45 % NaCl with KCl 20 mEq/L 100 mL/hr at 03/26/12 0600   PRN Meds:.ALPRAZolam, diphenhydrAMINE, diphenhydrAMINE, naloxone, [COMPLETED] ondansetron (ZOFRAN) IV, ondansetron (ZOFRAN) IV,  ondansetron (ZOFRAN) IV, oxyCODONE, oxyCODONE-acetaminophen, potassium chloride, senna-docusate, sodium chloride, traMADol, [DISCONTINUED] 0.9 % irrigation (POUR BTL), [DISCONTINUED] acetaminophen, [DISCONTINUED] bupivacaine, [DISCONTINUED] bupivacaine 0.5 % ON-Q pump SINGLE CATH 400 mL [DISCONTINUED]  HYDROmorphone (DILAUDID) injection  General appearance: alert, cooperative and no distress Heart: regular rate and rhythm Lungs: diminished in bases Abdomen: soft, nontender Extremities: no edema Wound: dressings intact  Lab Results: CBC: Basename 03/26/12 0500  WBC 12.4*  HGB 12.0  HCT 35.5*  PLT 256   BMET:  Basename 03/26/12 0500  NA 135  K 4.0  CL 100  CO2 23  GLUCOSE 173*  BUN 7  CREATININE 0.59  CALCIUM 8.5    PT/INR: No results found for this basename: LABPROT,INR in the last 72 hours Radiology: Dg Chest Port 1 View  03/26/2012  *RADIOLOGY REPORT*  Clinical Data: Postop VATS.  PORTABLE CHEST - 1 VIEW  Comparison: 03/25/2012  Findings: Support devices are unchanged.  Cardiomegaly with bibasilar atelectasis, stable.  Two left chest tubes.  No pneumothorax.  IMPRESSION: Stable exam.   Original Report Authenticated By: Charlett Nose, M.D.    Dg Chest Portable 1 View  03/25/2012  *RADIOLOGY REPORT*  Clinical Data: Status post  resection of the lingular pulmonary nodule.  PORTABLE CHEST - 1 VIEW  Comparison: 03/21/2012  Findings: Postoperatively, two chest tubes and a left subclavian central line are present.  There is no evidence of pneumothorax. Central line tip terminates in the proximal SVC.  Bibasilar atelectasis present, left greater than right.  No overt edema or pleural effusions.  Heart size is stable.  IMPRESSION: No pneumothorax after left sided pulmonary resection.  Bibasilar atelectasis present, left greater than right.   Original Report Authenticated By: Irish Lack, M.D.      Assessment/Plan: S/P Procedure(s) (LRB): VIDEO BRONCHOSCOPY (N/A) VIDEO ASSISTED  THORACOSCOPY (VATS)/ LOBECTOMY (Left)  1. CT's 250 cc, post op, no air leak ,  will d/c one tube-posterior 2 H/H slight anemia, acute blood loss 3 slight leukocytosis 4 push pulm toilet for ATX/mobilize 5 lovenox 6 tx to 3300 7 d/c aline Serai Tukes E 03/26/2012 8:33 AM

## 2012-03-26 NOTE — Progress Notes (Signed)
Patient ID: [name] [name], female   DOB: 10/09/1960, 51 y.o.   MRN: [id number]                   [address]            [place] [address]          [phone]     1 Day Post-Op Procedure(s) (LRB): VIDEO BRONCHOSCOPY (N/A) VIDEO ASSISTED THORACOSCOPY (VATS)/ LOBECTOMY (Left)  Total Length of Stay:  LOS: 1 day  BP 119/71  Pulse 75  Temp 98.5 F (36.9 C) (Oral)  Resp 13  Wt 191 lb 2.2 oz (86.7 kg)  SpO2 90%  .Intake/Output      11/13 0701 - 11/14 0700   I.V. (mL/kg) 1150 (13.3)   IV Piggyback 150   Total Intake(mL/kg) 1300 (15)   Urine (mL/kg/hr) 1875 (1.8)   Blood    Chest Tube 100   Total Output 1975   Net -675            . bupivacaine ON-Q pain pump    . dextrose 5 % and 0.45 % NaCl with KCl 20 mEq/L 100 mL/hr at 03/26/12 1456     Lab Results  Component Value Date   WBC 12.4* 03/26/2012   HGB 12.0 03/26/2012   HCT 35.5* 03/26/2012   PLT 256 03/26/2012   GLUCOSE 173* 03/26/2012   ALT 16 03/21/2012   AST 15 03/21/2012   NA 135 03/26/2012   K 4.0 03/26/2012   CL 100 03/26/2012   CREATININE 0.59 03/26/2012   BUN 7 03/26/2012   CO2 23 03/26/2012   INR 1.01 03/21/2012   Good pain control, stable day, no air  leak  Delight Ovens MD  Beeper [phone] Office [phone] 03/26/2012 7:15 PM

## 2012-03-27 ENCOUNTER — Inpatient Hospital Stay (HOSPITAL_COMMUNITY): Payer: BC Managed Care – PPO

## 2012-03-27 LAB — COMPREHENSIVE METABOLIC PANEL
Albumin: 3.6 g/dL (ref 3.5–5.2)
BUN: 4 mg/dL — ABNORMAL LOW (ref 6–23)
Calcium: 8.9 mg/dL (ref 8.4–10.5)
GFR calc Af Amer: >90 mL/min (ref >90)
Glucose, Bld: 137 mg/dL — ABNORMAL HIGH (ref 70–99)
Sodium: 138 mEq/L (ref 135–145)
Total Protein: 6.6 g/dL (ref 6.0–8.3)

## 2012-03-27 LAB — CBC
HCT: 38.5 % (ref 36.0–46.0)
Hemoglobin: 12.7 g/dL (ref 12.0–15.0)
MCH: 28 pg (ref 26.0–34.0)
MCHC: 33 g/dL (ref 30.0–36.0)
RDW: 13.6 % (ref 11.5–15.5)

## 2012-03-27 NOTE — Progress Notes (Signed)
Patient ID: [name] [name], female   DOB: 08/07/1960, 51 y.o.   MRN: [id number] TCTS DAILY PROGRESS NOTE                   [address]            [place] [address]          [phone]      2 Days Post-Op Procedure(s) (LRB): VIDEO BRONCHOSCOPY (N/A) VIDEO ASSISTED THORACOSCOPY (VATS)/ LOBECTOMY (Left)  Total Length of Stay:  LOS: 2 days   Subjective: Better pain control, on  pca  Objective: Vital signs in last 24 hours: Temp:  [98.3 F (36.8 C)-99.1 F (37.3 C)] 99.1 F (37.3 C) (11/14 0744) Pulse Rate:  [72-95] 88  (11/14 0700) Cardiac Rhythm:  [-] Normal sinus rhythm (11/13 2000) Resp:  [11-29] 15  (11/14 0700) BP: (98-140)/(58-89) 124/81 mmHg (11/14 0700) SpO2:  [4 %-97 %] 91 % (11/14 0841) Arterial Line BP: (126-168)/(71-102) 168/102 mmHg (11/13 1100) Weight:  [189 lb 9.5 oz (86 kg)] 189 lb 9.5 oz (86 kg) (11/14 0600)  Filed Weights   03/26/12 0600 03/27/12 0600  Weight: 191 lb 2.2 oz (86.7 kg) 189 lb 9.5 oz (86 kg)    Weight change: -1 lb 8.7 oz (-0.7 kg)   Hemodynamic parameters for last 24 hours:    Intake/Output from previous day: 11/13 0701 - 11/14 0700 In: 2125 [I.V.:1975; IV Piggyback:150] Out: 3525 [Urine:3425; Chest Tube:100]  Intake/Output this shift:    Current Meds: Scheduled Meds:   . [COMPLETED] acetaminophen  1,000 mg Intravenous Q6H  . albuterol  2 puff Inhalation Q6H  . amLODipine  10 mg Oral Daily  . bisacodyl  10 mg Oral Daily  . [COMPLETED] cefUROXime (ZINACEF)  IV  1.5 g Intravenous Q12H  . enoxaparin (LOVENOX) injection  30 mg Subcutaneous Q24H  . fentaNYL   Intravenous Q4H  . nebivolol  5 mg Oral Daily  . pantoprazole  40 mg Oral Daily  . [DISCONTINUED] famotidine  20 mg Oral QHS   Continuous Infusions:   . bupivacaine ON-Q pain pump    . dextrose 5 % and 0.45 % NaCl with KCl 20 mEq/L 75 mL/hr at 03/27/12 0600  . [DISCONTINUED] dextrose 5 % and 0.45 % NaCl with KCl 20 mEq/L 100 mL/hr at 03/26/12 1456   PRN  Meds:.ALPRAZolam, diphenhydrAMINE, diphenhydrAMINE, naloxone, ondansetron (ZOFRAN) IV, ondansetron (ZOFRAN) IV, [EXPIRED] oxyCODONE, oxyCODONE-acetaminophen, potassium chloride, promethazine, senna-docusate, sodium chloride, traMADol  General appearance: alert and cooperative Neurologic: intact Heart: regular rate and rhythm, S1, S2 normal, no murmur, click, rub or gallop and normal apical impulse Lungs: clear to auscultation bilaterally and normal percussion bilaterally Abdomen: soft, non-tender; bowel sounds normal; no masses,  no organomegaly Extremities: extremities normal, atraumatic, no cyanosis or edema and Homans sign is negative, no sign of DVT no air leak from ct  Lab Results: CBC: Basename 03/27/12 0455 03/26/12 0500  WBC 13.0* 12.4*  HGB 12.7 12.0  HCT 38.5 35.5*  PLT 280 256   BMET:  Basename 03/27/12 0455 03/26/12 0500  NA 138 135  K 3.9 4.0  CL 101 100  CO2 29 23  GLUCOSE 137* 173*  BUN 4* 7  CREATININE 0.56 0.59  CALCIUM 8.9 8.5    PT/INR: No results found for this basename: LABPROT,INR in the last 72 hours Radiology: Dg Chest Port 1 View  03/27/2012  *RADIOLOGY REPORT*  Clinical Data: Left upper lobe mass post VATS  PORTABLE CHEST - 1  VIEW  Comparison: Portable exam 0643 hours compared to 03/26/2012  Findings: Single left thoracostomy tube remains. Left subclavian and venous catheter tip projecting over SVC near/at azygos confluence. Stable heart size and mediastinal contours. Low lung volumes with bibasilar atelectasis. Upper lungs clear. No pneumothorax.  IMPRESSION: Bibasilar atelectasis.   Original Report Authenticated By: Ulyses Southward, M.D.    Dg Chest Port 1 View  03/26/2012  *RADIOLOGY REPORT*  Clinical Data: Postop VATS.  PORTABLE CHEST - 1 VIEW  Comparison: 03/25/2012  Findings: Support devices are unchanged.  Cardiomegaly with bibasilar atelectasis, stable.  Two left chest tubes.  No pneumothorax.  IMPRESSION: Stable exam.   Original Report Authenticated  By: Charlett Nose, M.D.    Dg Chest Portable 1 View  03/25/2012  *RADIOLOGY REPORT*  Clinical Data: Status post resection of the lingular pulmonary nodule.  PORTABLE CHEST - 1 VIEW  Comparison: 03/21/2012  Findings: Postoperatively, two chest tubes and a left subclavian central line are present.  There is no evidence of pneumothorax. Central line tip terminates in the proximal SVC.  Bibasilar atelectasis present, left greater than right.  No overt edema or pleural effusions.  Heart size is stable.  IMPRESSION: No pneumothorax after left sided pulmonary resection.  Bibasilar atelectasis present, left greater than right.   Original Report Authenticated By: Irish Lack, M.D.      Assessment/Plan: S/P Procedure(s) (LRB): VIDEO BRONCHOSCOPY (N/A) VIDEO ASSISTED THORACOSCOPY (VATS)/ LOBECTOMY (Left) Mobilize Diuresis Plan for transfer to step-down: see transfer orders D/c foley No air leak from ct, to water seal, poss remove tomorrow Final path still pending     Jozey Janco B 03/27/2012 9:03 AM

## 2012-03-27 NOTE — Progress Notes (Signed)
Chaplain visited patient during his daily routine rounds. Patient was responsive and alert. Patient expressed hope in her recovery from heart surgery. Patient also shared the support she received from her husband. Chaplain provided ministry of empathic listening and presence. Chaplain shared words of hope and comfort with patient, and will continue to visit as needed to provide spiritual care to patient. Patient thanked Chaplain for the support and encouragement.

## 2012-03-28 ENCOUNTER — Inpatient Hospital Stay (HOSPITAL_COMMUNITY): Payer: BC Managed Care – PPO

## 2012-03-28 LAB — BASIC METABOLIC PANEL
BUN: 6 mg/dL (ref 6–23)
CO2: 31 mEq/L (ref 19–32)
Calcium: 8.8 mg/dL (ref 8.4–10.5)
Chloride: 100 mEq/L (ref 96–112)
Creatinine, Ser: 0.65 mg/dL (ref 0.50–1.10)
GFR calc Af Amer: >90 mL/min (ref >90)
GFR calc non Af Amer: >90 mL/min (ref >90)
Glucose, Bld: 119 mg/dL — ABNORMAL HIGH (ref 70–99)
Potassium: 3.6 mEq/L (ref 3.5–5.1)
Sodium: 136 mEq/L (ref 135–145)

## 2012-03-28 LAB — CBC
HCT: 35.7 % — ABNORMAL LOW (ref 36.0–46.0)
Hemoglobin: 11.7 g/dL — ABNORMAL LOW (ref 12.0–15.0)
MCH: 28.1 pg (ref 26.0–34.0)
MCHC: 32.8 g/dL (ref 30.0–36.0)
MCV: 85.6 fL (ref 78.0–100.0)
Platelets: 258 10*3/uL (ref 150–400)
RBC: 4.17 MIL/uL (ref 3.87–5.11)
RDW: 13.6 % (ref 11.5–15.5)
WBC: 10.7 10*3/uL — ABNORMAL HIGH (ref 4.0–10.5)

## 2012-03-28 MED ORDER — SODIUM CHLORIDE 0.9 % IJ SOLN
INTRAMUSCULAR | Status: AC
  Filled 2012-03-28: qty 30

## 2012-03-28 MED ORDER — SODIUM CHLORIDE 0.9 % IJ SOLN: 10.0000 mL | INTRAMUSCULAR | Status: DC | PRN

## 2012-03-28 MED ORDER — SODIUM CHLORIDE 0.9 % IJ SOLN
10.0000 mL | Freq: Two times a day (BID) | INTRAMUSCULAR | Status: DC
  Administered 2012-03-28: 10 mL
  Administered 2012-03-29: 20 mL
  Filled 2012-03-28: qty 10

## 2012-03-28 NOTE — Progress Notes (Addendum)
3 Days Post-Op Procedure(s) (LRB): VIDEO BRONCHOSCOPY (N/A) VIDEO ASSISTED THORACOSCOPY (VATS)/ LOBECTOMY (Left) Subjective:  [name] [name] has no complaints this morning.  She is hopeful to have her chest tube removed today.  Objective: Vital signs in last 24 hours: Temp:  [97.9 F (36.6 C)-100.1 F (37.8 C)] 97.9 F (36.6 C) (11/15 0729) Pulse Rate:  [81-93] 81  (11/15 0400) Cardiac Rhythm:  [-] Normal sinus rhythm (11/15 0400) Resp:  [12-18] 17  (11/15 0400) BP: (102-130)/(64-92) 104/64 mmHg (11/15 0400) SpO2:  [90 %-96 %] 94 % (11/15 0400)  Intake/Output from previous day: 11/14 0701 - 11/15 0700 In: 863.3 [I.V.:813.3; IV Piggyback:50] Out: 1375 [Urine:1125; Chest Tube:250]  General appearance: alert, cooperative and no distress Heart: regular rate and rhythm, S1, S2 normal, no murmur, click, rub or gallop Lungs: clear to auscultation bilaterally Abdomen: soft, non-tender; bowel sounds normal; no masses,  no organomegaly Extremities: extremities normal, atraumatic, no cyanosis or edema Wound: clean and dry  Lab Results:  [facility] 03/28/12 0411 03/27/12 0455  WBC 10.7* 13.0*  HGB 11.7* 12.7  HCT 35.7* 38.5  PLT 258 280   BMET:  Basename 03/28/12 0411 03/27/12 0455  NA 136 138  K 3.6 3.9  CL 100 101  CO2 31 29  GLUCOSE 119* 137*  BUN 6 4*  CREATININE 0.65 0.56  CALCIUM 8.8 8.9    PT/INR: No results found for this basename: LABPROT,INR in the last 72 hours ABG    Component Value Date/Time   PHART 7.328* 03/26/2012 0500   HCO3 23.6 03/26/2012 0500   TCO2 25.0 03/26/2012 0500   ACIDBASEDEF 1.5 03/26/2012 0500   O2SAT 95.8 03/26/2012 0500   CBG (last 3)  No results found for this basename: GLUCAP:3 in the last 72 hours  Assessment/Plan: S/P Procedure(s) (LRB): VIDEO BRONCHOSCOPY (N/A) VIDEO ASSISTED THORACOSCOPY (VATS)/ LOBECTOMY (Left)  1. Chest tube- no air leak, on water seal chest tube 250cc of output yesterday 2. Pulm- increasing atelectasis,  encouraged use of IS 3. Pathology pending 4. Dispo- chest tube with no air leak, no pneumothorax on chest xray, output was 250cc- will likely leave chest tube in place today until output decreases   LOS: 3 days    [name], [name] 03/28/2012   I have seen and examined the patient and agree with the assessment and plan as outlined.  Probably can d/c tube in am, potentially d/c home tomorrow afternoon or Sunday.  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  All nodes negative.  Discussed with patient.  [name],[name] 03/28/2012 5:00 PM

## 2012-03-29 ENCOUNTER — Inpatient Hospital Stay (HOSPITAL_COMMUNITY): Payer: BC Managed Care – PPO

## 2012-03-29 NOTE — Progress Notes (Signed)
4 Days Post-Op Procedure(s) (LRB): VIDEO BRONCHOSCOPY (N/A) VIDEO ASSISTED THORACOSCOPY (VATS)/ LOBECTOMY (Left) Subjective:  Ms. Yoke has no complaints this morning.  She states her pain is much better than before.  Objective: Vital signs in last 24 hours: Temp:  [98 F (36.7 C)-98.5 F (36.9 C)] 98.5 F (36.9 C) (11/16 0807) Pulse Rate:  [84-94] 85  (11/16 0312) Cardiac Rhythm:  [-] Normal sinus rhythm (11/16 0312) Resp:  [14-18] 16  (11/16 0312) BP: (111-123)/(72-79) 111/72 mmHg (11/16 0312) SpO2:  [90 %-97 %] 90 % (11/16 0312) Weight:  [189 lb 9.5 oz (86 kg)] 189 lb 9.5 oz (86 kg) (11/15 2000)   Intake/Output from previous day: 11/15 0701 - 11/16 0700 In: 500 [P.O.:240; I.V.:260] Out: 525 [Urine:475; Chest Tube:50]  General appearance: alert, cooperative and no distress Heart: regular rate and rhythm Lungs: clear to auscultation bilaterally Abdomen: soft, non-tender; bowel sounds normal; no masses,  no organomegaly Wound: clean and dry  Lab Results:  Basename 03/28/12 0411 03/27/12 0455  WBC 10.7* 13.0*  HGB 11.7* 12.7  HCT 35.7* 38.5  PLT 258 280   BMET:  Basename 03/28/12 0411 03/27/12 0455  NA 136 138  K 3.6 3.9  CL 100 101  CO2 31 29  GLUCOSE 119* 137*  BUN 6 4*  CREATININE 0.65 0.56  CALCIUM 8.8 8.9    PT/INR: No results found for this basename: LABPROT,INR in the last 72 hours ABG    Component Value Date/Time   PHART 7.328* 03/26/2012 0500   HCO3 23.6 03/26/2012 0500   TCO2 25.0 03/26/2012 0500   ACIDBASEDEF 1.5 03/26/2012 0500   O2SAT 95.8 03/26/2012 0500   CBG (last 3)  No results found for this basename: GLUCAP:3 in the last 72 hours  Assessment/Plan: S/P Procedure(s) (LRB): VIDEO BRONCHOSCOPY (N/A) VIDEO ASSISTED THORACOSCOPY (VATS)/ LOBECTOMY (Left)  1. Chest tube in place- no evidence of air leak, 50cc output yesterday, CXR with new tiny apical pneumothorax 2. Pulm- no acute issues, off oxygen, encouraged continued IS use 3.  Pathology- + Neuroendocrine Tumor 4. Dispo- patient doing well, no air leak, minimal chest tube output, will d/c chest tube if pneumothorax remains stable will d/c home in the AM   LOS: 4 days    Alim Cattell 03/29/2012

## 2012-03-30 ENCOUNTER — Inpatient Hospital Stay (HOSPITAL_COMMUNITY): Payer: BC Managed Care – PPO

## 2012-03-30 DIAGNOSIS — D3A090 Benign carcinoid tumor of the bronchus and lung: Secondary | ICD-10-CM

## 2012-03-30 DIAGNOSIS — Z9889 Other specified postprocedural states: Secondary | ICD-10-CM

## 2012-03-30 MED ORDER — CYCLOBENZAPRINE HCL 5 MG PO TABS
5.0000 mg | ORAL_TABLET | Freq: Three times a day (TID) | ORAL | Status: DC | PRN
Start: 2012-03-30 — End: 2012-05-15

## 2012-03-30 MED ORDER — TRAMADOL HCL 50 MG PO TABS: 50.0000 mg | ORAL_TABLET | Freq: Four times a day (QID) | ORAL | Status: DC | PRN

## 2012-03-30 MED ORDER — OXYCODONE-ACETAMINOPHEN 5-325 MG PO TABS: 1.0000 | ORAL_TABLET | ORAL | Status: DC | PRN

## 2012-03-30 NOTE — Progress Notes (Signed)
Pt d/c home per MD order, d/c instructions and scripts given, family at [facility], pt VSS, pt and family verbalized understanding of d/c and instructions

## 2012-03-30 NOTE — Discharge Summary (Signed)
Physician Discharge Summary  Patient ID: [name] [name] MRN: [id number] DOB/AGE: 49/30/1962 49 y.o.  Admit date: 03/25/2012 Discharge date: 03/30/2012  Admission Diagnoses:  Patient Active Problem List  Diagnosis  . Chest pain on breathing, negative MI.  [name] HTN (hypertension)  . Family history of coronary artery disease  . Asthma  . Pulmonary nodule   Discharge Diagnoses:   Patient Active Problem List  Diagnosis  . Chest pain on breathing, negative MI.  [name] HTN (hypertension)  . Family history of coronary artery disease  . Asthma  . Pulmonary nodule  . Carcinoid tumor of lung  . S/P thoracotomy   Discharged Condition: good  History of Present Illness:   [name] [name] is a 51 yo white female with no smoking history who presented to her PCP with a complaint of shortness of breath and chest discomfort after having ankle surgery.  Workup with CT scan was done for possible Pulmonary Embolism.  However, at that time she was found to have a left lung nodule  The patient did not have routine follow up for this nodule.  However, last month she again developed similar symptoms.  Cardiac workup was completed including stress test and catheterization which was unremarkable.  However, she underwent CT scan which showed the left lung nodule had enlarged since previous film.  It was felt that time the patient should undergo further workup.  PET CT scan was obtained and the nodule did show some evidence of metabolic activity, resulting in referral to Dr. Tyrone Sage for possible surgical intervention.  She was evaluated by Dr. Tyrone Sage on 03/21/2012 at which time they felt she would benefit from VATS and resection of the pulmonary nodule.  The risks and benefits of the procedure were explained to the patient and she was agreeable to proceed.  Surgery was tentatively scheduled for 03/25/2012.  Hospital Course:   The patient presented to [facility]25[facility].  She was taken to the operating room  and underwent Bronchoscopy, Left VATS, Mini Thoracotomy with Lingulectomy and Left upper Lobectomy with lymph node dissection. The patient tolerated the procedure well and was taken to the PACU in stable condition.  Patient has done well post operatively.  Her chest tubes were removed without difficulty.  She does have a tiny residual pneumothorax on the right which has been stable during admission.  Her pain is controlled with oral medications.  Her final pathology confirmed the presence of carcinoid tumor and report is included below.  She will be discharged home today.  She will follow up with Dr. Tyrone Sage in 2 weeks with a chest xray.    Significant Diagnostic Studies: Pathology  1. Lymph node, biopsy, 12L - THERE IS NO EVIDENCE OF MALIGNANCY IN 1 OF 1 LYMPH NODE (0/1). 2. Lung, biopsy, Left lingular lesion - LOW GRADE NEUROENDOCRINE TUMOR (CARCINOID TUMOR). 3. Lung, resection (segmental or lobe), Left lingula - LOW GRADE NEUROENDOCRINE TUMOR (CARCINOID TUMOR), 3.1 CM. - TUMOR IS PRESENT AT THE BRONCHIAL MARGIN OF SPECIMEN #3. - LYMPHOVASCULAR INVASION IS IDENTIFIED, FOCAL. - SEE ONCOLOGY TABLE BELOW. 4. Lung, resection (segmental or lobe), Left upper lobe - BENIGN LUNG PARENCHYMA WITH MILD ANTHRACOSIS. - TWO BENIGN PERIHILAR LYMPH NODES (0/2). 5. Lymph node, biopsy, 11L - THERE IS NO EVIDENCE OF MALIGNANCY IN 1 OF 1 LYMPH NODE (0/1). 6. Lymph node, biopsy, 10L - THERE IS NO EVIDENCE OF MALIGNANCY IN 1 OF 1 LYMPH NODE (0/1).  Treatments: surgery:   PROCEDURES PERFORMED: Bronchoscopy, left video-assisted thoracoscopy,  mini  thoracotomy, lingulectomy, left upper lobectomy with lymph node  dissection.  Disposition: 01-Home or Self Care     Medication List     As of 03/30/2012 11:15 AM    TAKE these medications         acetaminophen 500 MG tablet   Commonly known as: TYLENOL   Take 1,000 mg by mouth every 6 (six) hours as needed. For pain      albuterol 108 (90 BASE) MCG/ACT  inhaler   Commonly known as: PROVENTIL HFA;VENTOLIN HFA   Inhale 2 puffs into the lungs every 6 (six) hours as needed. For shortness of breath      ALPRAZolam 0.5 MG tablet   Commonly known as: XANAX   Take 0.5 mg by mouth 3 (three) times daily as needed. For anxiety      amLODipine 10 MG tablet   Commonly known as: NORVASC   Take 10 mg by mouth daily.      cyclobenzaprine 5 MG tablet   Commonly known as: FLEXERIL   Take 1 tablet (5 mg total) by mouth 3 (three) times daily as needed for muscle spasms.      diphenhydrAMINE 25 MG tablet   Commonly known as: BENADRYL   Take 25 mg by mouth every 6 (six) hours as needed. For sleep      famotidine 20 MG tablet   Commonly known as: PEPCID   One at bedtime      nebivolol 5 MG tablet   Commonly known as: BYSTOLIC   Take 1 tablet (5 mg total) by mouth daily.      oxyCODONE-acetaminophen 5-325 MG per tablet   Commonly known as: PERCOCET/ROXICET   Take 1-2 tablets by mouth every 4 (four) hours as needed.      pantoprazole 40 MG tablet   Commonly known as: PROTONIX   Take 1 tablet (40 mg total) by mouth daily. Take 30-60 min before first meal of the day      traMADol 50 MG tablet   Commonly known as: ULTRAM   Take 1-2 tablets (50-100 mg total) by mouth every 6 (six) hours as needed.           Follow-up Information    Follow up with GERHARDT,EDWARD B, MD. In 2 weeks. (Office will contact you with appointment date and time)    Contact information:   [address] [employer] [address] [place] [address] [phone]       Follow up with [facility]. In 2 weeks. (Please get chest xray one hour prior to appoitment)    Contact information:   [address] [place] [address]          Signed: Lowella Dandy 03/30/2012, 11:15 AM

## 2012-03-30 NOTE — Progress Notes (Signed)
R IJ d/c per MD order, pt tol well, HOB trend, pt educated to lie flat for aprox 1 hr, then plan for d/c

## 2012-03-30 NOTE — Progress Notes (Signed)
CT d/c per md oder, pt tol well, no complaints, educated to call RN if any changes in condition or concerns

## 2012-03-30 NOTE — Progress Notes (Signed)
5 Days Post-Op Procedure(s) (LRB): VIDEO BRONCHOSCOPY (N/A) VIDEO ASSISTED THORACOSCOPY (VATS)/ LOBECTOMY (Left) Subjective:  Ms. Munos complains of muscle spasms along her left chest wall.  She is hopeful to be discharged home today  Objective: Vital signs in last 24 hours: Temp:  [98.1 F (36.7 C)-99.3 F (37.4 C)] 98.4 F (36.9 C) (11/17 0800) Pulse Rate:  [82-93] 88  (11/17 0800) Cardiac Rhythm:  [-] Normal sinus rhythm (11/17 0800) Resp:  [15-27] 21  (11/17 0349) BP: (109-124)/(74-85) 124/83 mmHg (11/17 0800) SpO2:  [92 %-95 %] 95 % (11/17 0800)   Intake/Output from previous day: 11/16 0701 - 11/17 0700 In: 1260 [P.O.:840; I.V.:420] Out: 0   General appearance: alert, cooperative and no distress Neurologic: intact Heart: regular rate and rhythm Lungs: clear to auscultation bilaterally Abdomen: soft, non-tender; bowel sounds normal; no masses,  no organomegaly Wound: clean and dry  Lab Results:  Basename 03/28/12 0411  WBC 10.7*  HGB 11.7*  HCT 35.7*  PLT 258   BMET:  Basename 03/28/12 0411  NA 136  K 3.6  CL 100  CO2 31  GLUCOSE 119*  BUN 6  CREATININE 0.65  CALCIUM 8.8    PT/INR: No results found for this basename: LABPROT,INR in the last 72 hours ABG    Component Value Date/Time   PHART 7.328* 03/26/2012 0500   HCO3 23.6 03/26/2012 0500   TCO2 25.0 03/26/2012 0500   ACIDBASEDEF 1.5 03/26/2012 0500   O2SAT 95.8 03/26/2012 0500   CBG (last 3)  No results found for this basename: GLUCAP:3 in the last 72 hours  Assessment/Plan: S/P Procedure(s) (LRB): VIDEO BRONCHOSCOPY (N/A) VIDEO ASSISTED THORACOSCOPY (VATS)/ LOBECTOMY (Left)  1. Tiny Apical Pneumothorax- decreased in size after chest tube removal 2. Pulm- no acute issues, continue IS 3. Neuroendocrine Tumore 4. Muscle Spasms- will give Flexeril 5. Dispo- patient doing well, will d/c home today   LOS: 5 days    Raford Pitcher, Quanda Pavlicek 03/30/2012

## 2012-04-08 ENCOUNTER — Other Ambulatory Visit: Payer: Self-pay | Admitting: *Deleted

## 2012-04-08 DIAGNOSIS — G8918 Other acute postprocedural pain: Secondary | ICD-10-CM

## 2012-04-08 MED ORDER — TRAMADOL HCL 50 MG PO TABS: 50.0000 mg | ORAL_TABLET | Freq: Four times a day (QID) | ORAL | Status: DC | PRN

## 2012-04-15 ENCOUNTER — Other Ambulatory Visit: Payer: Self-pay | Admitting: Cardiothoracic Surgery

## 2012-04-15 DIAGNOSIS — R911 Solitary pulmonary nodule: Secondary | ICD-10-CM

## 2012-04-17 ENCOUNTER — Encounter: Payer: Self-pay | Admitting: Cardiothoracic Surgery

## 2012-04-17 ENCOUNTER — Ambulatory Visit (INDEPENDENT_AMBULATORY_CARE_PROVIDER_SITE_OTHER): Payer: Self-pay | Admitting: Cardiothoracic Surgery

## 2012-04-17 ENCOUNTER — Other Ambulatory Visit: Payer: Self-pay

## 2012-04-17 ENCOUNTER — Ambulatory Visit
Admission: RE | Admit: 2012-04-17 | Discharge: 2012-04-17 | Disposition: A | Discharge status: Home / Self Care | Payer: BC Managed Care – PPO | Source: Ambulatory Visit | Attending: Cardiothoracic Surgery | Admitting: Cardiothoracic Surgery

## 2012-04-17 VITALS — BP 123/87 | HR 82 | Resp 18 | Ht 63.0 in | Wt 185.0 lb

## 2012-04-17 DIAGNOSIS — D3A00 Benign carcinoid tumor of unspecified site: Secondary | ICD-10-CM

## 2012-04-17 DIAGNOSIS — Z902 Acquired absence of lung [part of]: Secondary | ICD-10-CM

## 2012-04-17 DIAGNOSIS — Z9889 Other specified postprocedural states: Secondary | ICD-10-CM

## 2012-04-17 DIAGNOSIS — R222 Localized swelling, mass and lump, trunk: Secondary | ICD-10-CM

## 2012-04-17 DIAGNOSIS — R911 Solitary pulmonary nodule: Secondary | ICD-10-CM

## 2012-04-17 DIAGNOSIS — R918 Other nonspecific abnormal finding of lung field: Secondary | ICD-10-CM

## 2012-04-17 DIAGNOSIS — Z09 Encounter for follow-up examination after completed treatment for conditions other than malignant neoplasm: Secondary | ICD-10-CM

## 2012-04-17 DIAGNOSIS — D3A8 Other benign neuroendocrine tumors: Secondary | ICD-10-CM

## 2012-04-17 DIAGNOSIS — G8918 Other acute postprocedural pain: Secondary | ICD-10-CM

## 2012-04-17 MED ORDER — HYDROCODONE-ACETAMINOPHEN 5-325 MG PO TABS: 1.0000 | ORAL_TABLET | Freq: Four times a day (QID) | ORAL | Status: DC | PRN

## 2012-04-17 NOTE — Telephone Encounter (Signed)
RX for Norco 5/325 mg 1 tab every 6 hours prn pain #40/ no refills called to pharm.

## 2012-04-17 NOTE — Patient Instructions (Addendum)
Thoracotomy Care After Refer to this sheet in the next few weeks. These instructions provide you with information on caring for yourself after your procedure. Your caregiver may also give you more specific instructions. Your treatment has been planned according to current medical practices, but problems sometimes occur. Call your caregiver if you have any problems or questions after your procedure. HOME CARE INSTRUCTIONS  Remove the bandage (dressing) over your chest tube site as directed by your caregiver.  It is normal to be sore for a couple weeks following surgery. See your caregiver if this seems to be getting worse rather than better.  Only take over-the-counter or prescription medicines for pain, discomfort, or fever as directed by your caregiver. It is very important to take pain medicine when you need it so that you will cough and breathe deeply enough to clear mucus (phlegm) and expand your lungs. This helps prevent a lung infection (pneumonia).  If it hurts to cough, hold a pillow against your chest when you cough. This may help with the discomfort. In spite of the discomfort, cough frequently.  Taking deep breaths keeps lungs inflated and protects against pneumonia. Most patients will go home with a device called an incentive spirometer that encourages deep breathing.  You may resume a normal diet and activities as directed.  Use showers for bathing until you see your caregiver, or as instructed.  Change dressings if necessary or as directed.  Avoid lifting or driving until you are instructed otherwise.  Make an appointment to see your caregiver for stitch (suture) or staple removal when instructed.  Do not travel by airplane for 2 weeks after the chest tube is removed. SEEK MEDICAL CARE IF:  You are bleeding from your wounds.  Your heartbeat seems irregular.  You have redness, swelling, or increasing pain in the wounds.  There is pus coming from your wounds.  There is  a bad smell coming from the wound or dressing. SEEK IMMEDIATE MEDICAL CARE IF:  You have a fever.  You develop a rash.  You have difficulty breathing.  You develop any reaction or side effects to medicines given.  You develop lightheadedness or feel faint.  You develop shortness of breath or chest pain. MAKE SURE YOU:  Understand these instructions.  Will watch your condition.  Will get help right away if you are not doing well or get worse. Document Released: 10/13/2010 Document Revised: 07/23/2011 Document Reviewed: 10/13/2010 [employer] Patient Information 2013 [employer].  

## 2012-04-17 NOTE — Progress Notes (Signed)
[address]            [place] [address]          [phone]      [facility] Medical Record #[id number] Date of Birth: May 25, 1960  Referring: Nyoka Cowden, MD Primary Care: Nonnie Done., MD  Chief Complaint:   POST OP FOLLOW UP 03/25/2012    OPERATIVE REPORT  PREOPERATIVE DIAGNOSIS: Left upper lobe lung mass, slowly expanding.  POSTOPERATIVE DIAGNOSIS: Probable carcinoid.  PROCEDURES PERFORMED: Bronchoscopy, left video-assisted thoracoscopy,  minithoracotomy, lingulectomy, left upper lobectomy with lymph node  dissection.  History of Present Illness:     Patient returns to the office today in followup after her recent left upper lobectomy for a slowly expanding pulmonary carcinoid. She's doing recently well since discharge though she is still complains of fatigue And soreness of her chest.     Past Medical History  Diagnosis Date  . Anxiety   . Obesity   . Hypertension     takes Amlodipine and Bystolic daily  . Asthma     since childhood-uses albuterol prn  . Shortness of breath     with exertion  . Seizures     as a child d/t hit to head;and about 4-41yrs ago came back and then  came back;last one 2+yrs ago  . Concussion     as a child  . Joint pain     d/t right leg break  . Eczema     on hands   . GERD (gastroesophageal reflux disease)     takes Protonix-started 03/20/12 and Pepcid   . Urinary frequency   . History of kidney stones   . Nocturia   . Urinary urgency   . Anemia     hx of-controlled by hysterectomy  . Panic attacks     takes Xanax daily  . Insomnia     takes Benadryl nightly  . PONV (postoperative nausea and vomiting)     b/p low and slow to come off of o2     History  Smoking status  . Never Smoker   Smokeless tobacco  . Never Used    History  Alcohol Use  . 0.6 oz/week  . 1 Glasses of wine per week    Comment: Occasionally     Allergies  Allergen Reactions  .  Dilantin (Phenytoin Sodium Extended) Other (See Comments)    Reaction unknown, from Childhood   . Keppra (Levetiracetam) Other (See Comments)     Paralysis of lt hand/arm  . Phenobarbital Other (See Comments)    Reaction unknown, from childhood  . Vimpat (Lacosamide) Other (See Comments)    Severe confusion    Current Outpatient Prescriptions  Medication Sig Dispense Refill  . acetaminophen (TYLENOL) 500 MG tablet Take 1,000 mg by mouth every 6 (six) hours as needed. For pain      . albuterol (PROVENTIL HFA;VENTOLIN HFA) 108 (90 BASE) MCG/ACT inhaler Inhale 2 puffs into the lungs every 6 (six) hours as needed. For shortness of breath      . ALPRAZolam (XANAX) 0.5 MG tablet Take 0.5 mg by mouth 3 (three) times daily as needed. For anxiety      . amLODipine (NORVASC) 10 MG tablet Take 10 mg by mouth daily.      . cyclobenzaprine (FLEXERIL) 5 MG tablet Take 1 tablet (5 mg total) by  mouth 3 (three) times daily as needed for muscle spasms.  10 tablet  0  . diphenhydrAMINE (BENADRYL) 25 MG tablet Take 25 mg by mouth every 6 (six) hours as needed. For sleep      . famotidine (PEPCID) 20 MG tablet One at bedtime  30 tablet  2  . nebivolol (BYSTOLIC) 5 MG tablet Take 1 tablet (5 mg total) by mouth daily.      . pantoprazole (PROTONIX) 40 MG tablet Take 1 tablet (40 mg total) by mouth daily. Take 30-60 min before first meal of the day  30 tablet  2  . HYDROcodone-acetaminophen (NORCO/VICODIN) 5-325 MG per tablet Take 1 tablet by mouth every 6 (six) hours as needed for pain.  40 tablet  0       Physical Exam: BP 123/87  Pulse 82  Resp 18  Ht 5\' 3"  (1.6 m)  Wt 185 lb (83.915 kg)  BMI 32.77 kg/m2  SpO2 95%  General appearance: alert and cooperative Neurologic: intact Heart: regular rate and rhythm, S1, S2 normal, no murmur, click, rub or gallop and normal apical impulse Lungs: clear to auscultation bilaterally and normal percussion bilaterally Abdomen: soft, non-tender; bowel sounds  normal; no masses,  no organomegaly Extremities: extremities normal, atraumatic, no cyanosis or edema and Homans sign is negative, no sign of DVT Wound: The left chest incisions are all healing well chest tube sutures were removed   Diagnostic Studies & Laboratory data:     Recent Radiology Findings:   Dg Chest 2 View  04/17/2012  *RADIOLOGY REPORT*  Clinical Data: History of left lung surgery 2 weeks ago, in the left upper lobe mass, history of pneumothorax  CHEST - 2 VIEW  Comparison: 03/30/2012  Findings: Heart size and vascular pattern are normal.  Central line has been removed.  The right lung is clear.  The left diaphragm is elevated, similar to prior study.  Previous left apical pneumothorax not identified on today's study.  Blunting left costophrenic angle suggesting small pleural effusion, decreased when compared to prior study.  IMPRESSION: Persistent left basilar atelectasis and pleural thickening. Resolution of left pneumothorax.   Original Report Authenticated By: Esperanza Heir, M.D.       Recent Lab Findings: Lab Results  Component Value Date   WBC 10.7* 03/28/2012   HGB 11.7* 03/28/2012   HCT 35.7* 03/28/2012   PLT 258 03/28/2012   GLUCOSE 119* 03/28/2012   ALT 17 03/27/2012   AST 23 03/27/2012   NA 136 03/28/2012   K 3.6 03/28/2012   CL 100 03/28/2012   CREATININE 0.65 03/28/2012   BUN 6 03/28/2012   CO2 31 03/28/2012   INR 1.01 03/21/2012      Assessment / Plan:      Overall she is making steady progress following recent left upper lobectomy for carcinoid tumor she was encouraged to continue her efforts at increasing ambulation. We'll see her back in one month with a followup chest x-ray she'll wait to return to work for 8 and additional 3 weeks as her work does involve some lifting and a significant amount of physical activity.      Delight Ovens MD  Beeper [phone] Office [phone] 04/18/2012 3:59 PM

## 2012-05-05 ENCOUNTER — Other Ambulatory Visit: Payer: Self-pay | Admitting: *Deleted

## 2012-05-05 DIAGNOSIS — G8918 Other acute postprocedural pain: Secondary | ICD-10-CM

## 2012-05-05 MED ORDER — HYDROCODONE-ACETAMINOPHEN 5-325 MG PO TABS: 1.0000 | ORAL_TABLET | Freq: Four times a day (QID) | ORAL | Status: AC | PRN

## 2012-05-12 ENCOUNTER — Other Ambulatory Visit: Payer: Self-pay | Admitting: *Deleted

## 2012-05-12 DIAGNOSIS — R918 Other nonspecific abnormal finding of lung field: Secondary | ICD-10-CM

## 2012-05-15 ENCOUNTER — Ambulatory Visit
Admission: RE | Admit: 2012-05-15 | Discharge: 2012-05-15 | Disposition: A | Discharge status: Home / Self Care | Payer: BC Managed Care – PPO | Source: Ambulatory Visit | Attending: Cardiothoracic Surgery | Admitting: Cardiothoracic Surgery

## 2012-05-15 ENCOUNTER — Ambulatory Visit (INDEPENDENT_AMBULATORY_CARE_PROVIDER_SITE_OTHER): Payer: Self-pay | Admitting: Cardiothoracic Surgery

## 2012-05-15 ENCOUNTER — Encounter: Payer: Self-pay | Admitting: Cardiothoracic Surgery

## 2012-05-15 VITALS — BP 122/87 | HR 82 | Resp 18 | Ht 63.0 in | Wt 185.0 lb

## 2012-05-15 DIAGNOSIS — R918 Other nonspecific abnormal finding of lung field: Secondary | ICD-10-CM

## 2012-05-15 DIAGNOSIS — Z9889 Other specified postprocedural states: Secondary | ICD-10-CM

## 2012-05-15 DIAGNOSIS — Z09 Encounter for follow-up examination after completed treatment for conditions other than malignant neoplasm: Secondary | ICD-10-CM

## 2012-05-15 DIAGNOSIS — D3A00 Benign carcinoid tumor of unspecified site: Secondary | ICD-10-CM

## 2012-05-15 DIAGNOSIS — Z902 Acquired absence of lung [part of]: Secondary | ICD-10-CM

## 2012-05-15 NOTE — Progress Notes (Signed)
[address]            [place] [address]          [phone]                       [name] Medical Record #[id number] Date of Birth: [birth year]  Referring: Nyoka Cowden, MD Primary Care: Nonnie Done., MD  Chief Complaint:   POST OP FOLLOW UP 03/25/2012    OPERATIVE REPORT  PREOPERATIVE DIAGNOSIS: Left upper lobe lung mass, slowly expanding.  POSTOPERATIVE DIAGNOSIS: Probable carcinoid.  PROCEDURES PERFORMED: Bronchoscopy, left video-assisted thoracoscopy,  minithoracotomy, lingulectomy, left upper lobectomy with lymph node  dissection.  History of Present Illness:     Patient returns to the office today in followup after her recent left upper lobectomy for a slowly expanding pulmonary carcinoid. The patient feels much better than on her previous postop visit. She is back caring for her course without difficulty. Still has some discomfort over the left chest but this is improving. She is anxious to return to work starting tomorrow.    Past Medical History  Diagnosis Date  . Anxiety   . Obesity   . Hypertension     takes Amlodipine and Bystolic daily  . Asthma     since childhood-uses albuterol prn  . Shortness of breath     with exertion  . Seizures     as a child d/t hit to head;and about 4-74yrs ago came back and then  came back;last one 2+yrs ago  . Concussion     as a child  . Joint pain     d/t right leg break  . Eczema     on hands   . GERD (gastroesophageal reflux disease)     takes Protonix-started 03/20/12 and Pepcid   . Urinary frequency   . History of kidney stones   . Nocturia   . Urinary urgency   . Anemia     hx of-controlled by hysterectomy  . Panic attacks     takes Xanax daily  . Insomnia     takes Benadryl nightly  . PONV (postoperative nausea and vomiting)     b/p low and slow to come off of o2     History  Smoking status  . Never Smoker   Smokeless tobacco  . Never Used      History  Alcohol Use  . 0.6 oz/week  . 1 Glasses of wine per week    Comment: Occasionally     Allergies  Allergen Reactions  . Dilantin (Phenytoin Sodium Extended) Other (See Comments)    Reaction unknown, from Childhood   . Keppra (Levetiracetam) Other (See Comments)     Paralysis of lt hand/arm  . Phenobarbital Other (See Comments)    Reaction unknown, from childhood  . Vimpat (Lacosamide) Other (See Comments)    Severe confusion    Current Outpatient Prescriptions  Medication Sig Dispense Refill  . albuterol (PROVENTIL HFA;VENTOLIN HFA) 108 (90 BASE) MCG/ACT inhaler Inhale 2 puffs into the lungs every 6 (six) hours as needed. For shortness of breath      . ALPRAZolam (XANAX) 0.5 MG tablet Take 0.5 mg by mouth 3 (three) times daily as needed. For anxiety      . amLODipine (NORVASC) 10 MG tablet Take 10 mg by mouth daily.      [name]  atenolol (TENORMIN) 50 MG tablet Take 50 mg by mouth daily.      . diphenhydrAMINE (BENADRYL) 25 MG tablet Take 25 mg by mouth every 6 (six) hours as needed. For sleep      . famotidine (PEPCID) 20 MG tablet One at bedtime  30 tablet  2  . HYDROcodone-acetaminophen (NORCO/VICODIN) 5-325 MG per tablet Take 1 tablet by mouth every 6 (six) hours as needed for pain.  40 tablet  0  . pantoprazole (PROTONIX) 40 MG tablet Take 1 tablet (40 mg total) by mouth daily. Take 30-60 min before first meal of the day  30 tablet  2       Physical Exam: BP 122/87  Pulse 82  Resp 18  Ht 5\' 3"  (1.6 m)  Wt 185 lb (83.915 kg)  BMI 32.77 kg/m2  SpO2 97%  General appearance: alert and cooperative Neurologic: intact Heart: regular rate and rhythm, S1, S2 normal, no murmur, click, rub or gallop and normal apical impulse Lungs: clear to auscultation bilaterally and normal percussion bilaterally Abdomen: soft, non-tender; bowel sounds normal; no masses,  no organomegaly Extremities: extremities normal, atraumatic, no cyanosis or edema and Homans sign is negative,  no sign of DVT Wound: The left chest incisions and chest tube sites are all healing well    Diagnostic Studies & Laboratory data:     Recent Radiology Findings:   Dg Chest 2 View  05/15/2012  *RADIOLOGY REPORT*  Clinical Data: Anterior left lower chest pain since removal of a chest tube following a VATS.  CHEST - 2 VIEW  Comparison: 04/17/2012.  Findings: The heart remains normal in size.  Decreased pleural fluid and atelectasis at the left lung base with some residual pleural fluid/thickening and linear density.  Clear right lung. Stable left hilar surgical clips.  No pneumothorax.  Minimal scoliosis.  IMPRESSION:   Decreased left pleural fluid and atelectasis with a small amount of residual pleural fluid/thickening and atelectasis/scarring.   Original Report Authenticated By: Beckie Salts, M.D.       Recent Lab Findings: Lab Results  Component Value Date   WBC 10.7* 03/28/2012   HGB 11.7* 03/28/2012   HCT 35.7* 03/28/2012   PLT 258 03/28/2012   GLUCOSE 119* 03/28/2012   ALT 17 03/27/2012   AST 23 03/27/2012   NA 136 03/28/2012   K 3.6 03/28/2012   CL 100 03/28/2012   CREATININE 0.65 03/28/2012   BUN 6 03/28/2012   CO2 31 03/28/2012   INR 1.01 03/21/2012      Assessment / Plan:      Overall she is making steady progress following recent left upper lobectomy for carcinoid tumor she was encouraged to continue her efforts at increasing ambulation. We'll see her back in  3 month with a followup chest x-ray.  She will return to work full duty starting tomorrow.   Delight Ovens MD  Beeper [phone] Office [phone] 05/15/2012 2:04 PM

## 2012-08-14 ENCOUNTER — Encounter: Payer: BC Managed Care – PPO | Admitting: Cardiothoracic Surgery

## 2013-03-19 ENCOUNTER — Other Ambulatory Visit: Payer: Self-pay

## 2013-11-02 IMAGING — CR DG CHEST 2V
2 series · 2 of 2 positions shown · non-contrast
Comparison: 03/06/2012
Correlation:  PET CT 03/19/2012, CT chest 03/07/2012

CLINICAL DATA: Lung mass, preoperative assessment of left lung
surgery

CHEST - 2 VIEW

[view not recorded (1 of 2)]
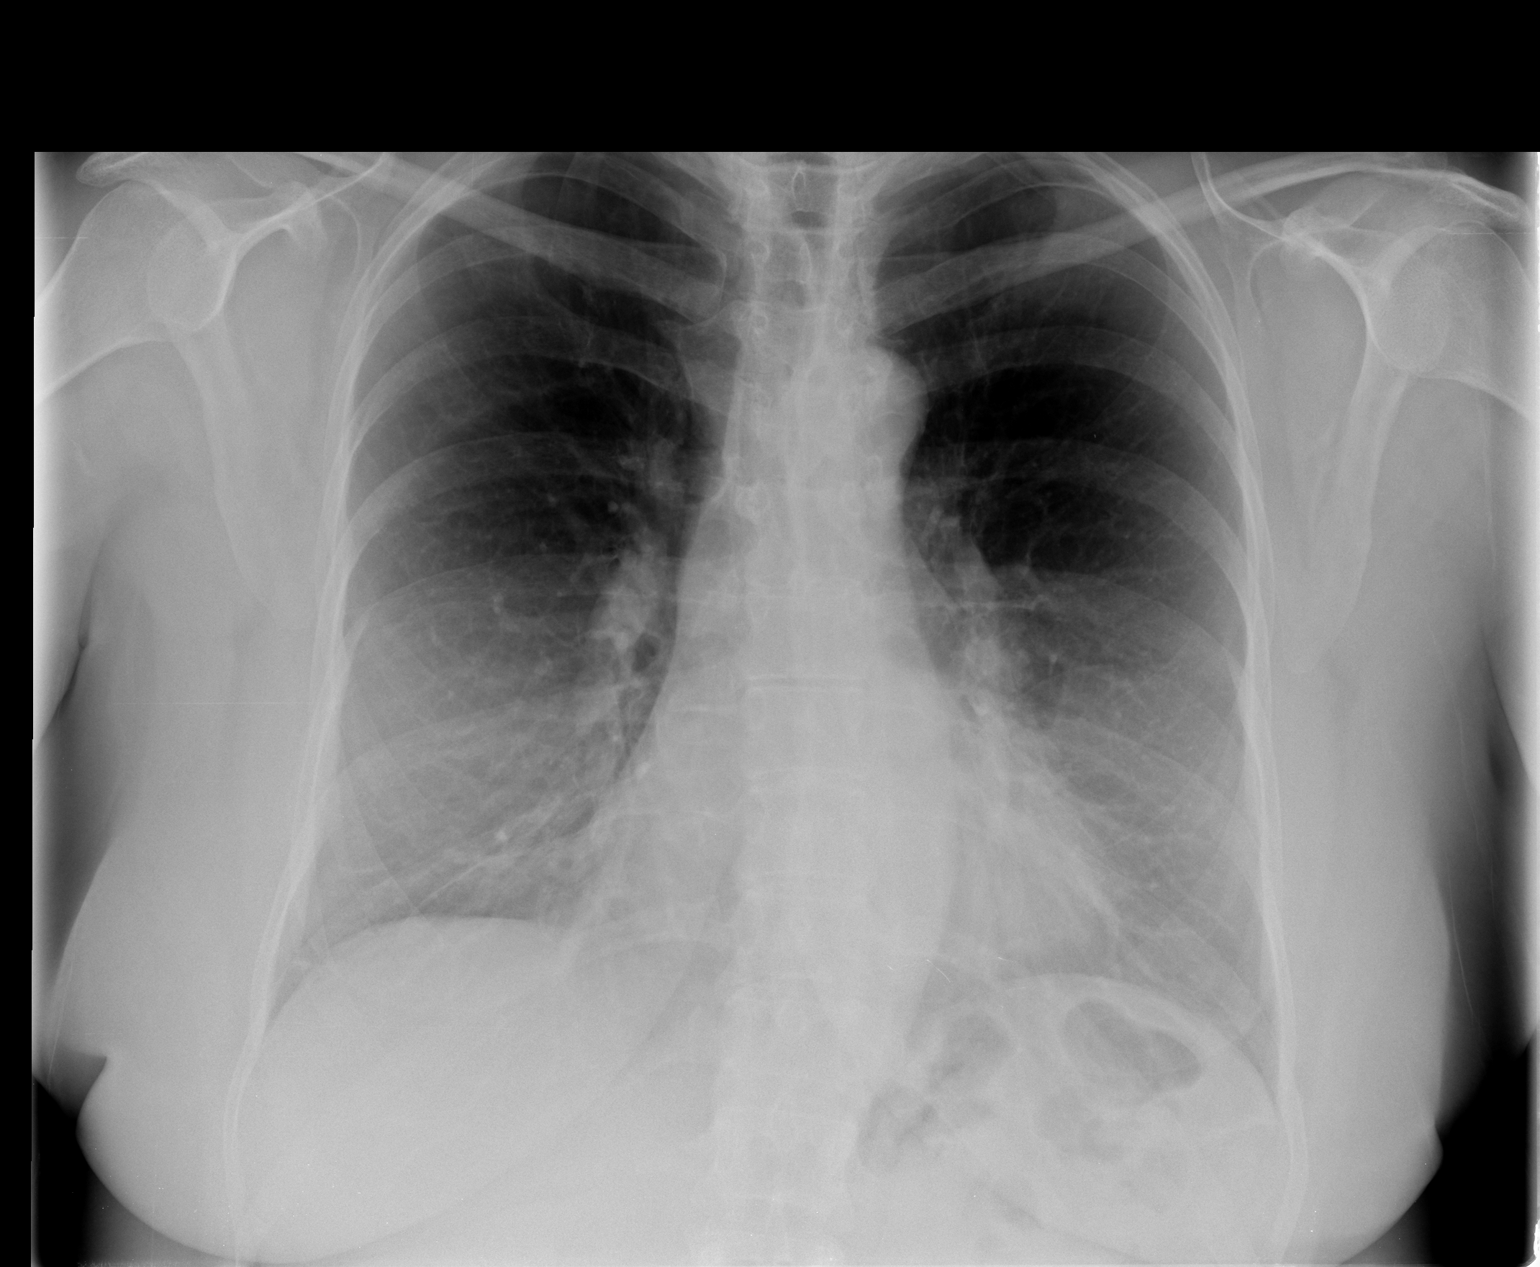

[view not recorded (2 of 2)]
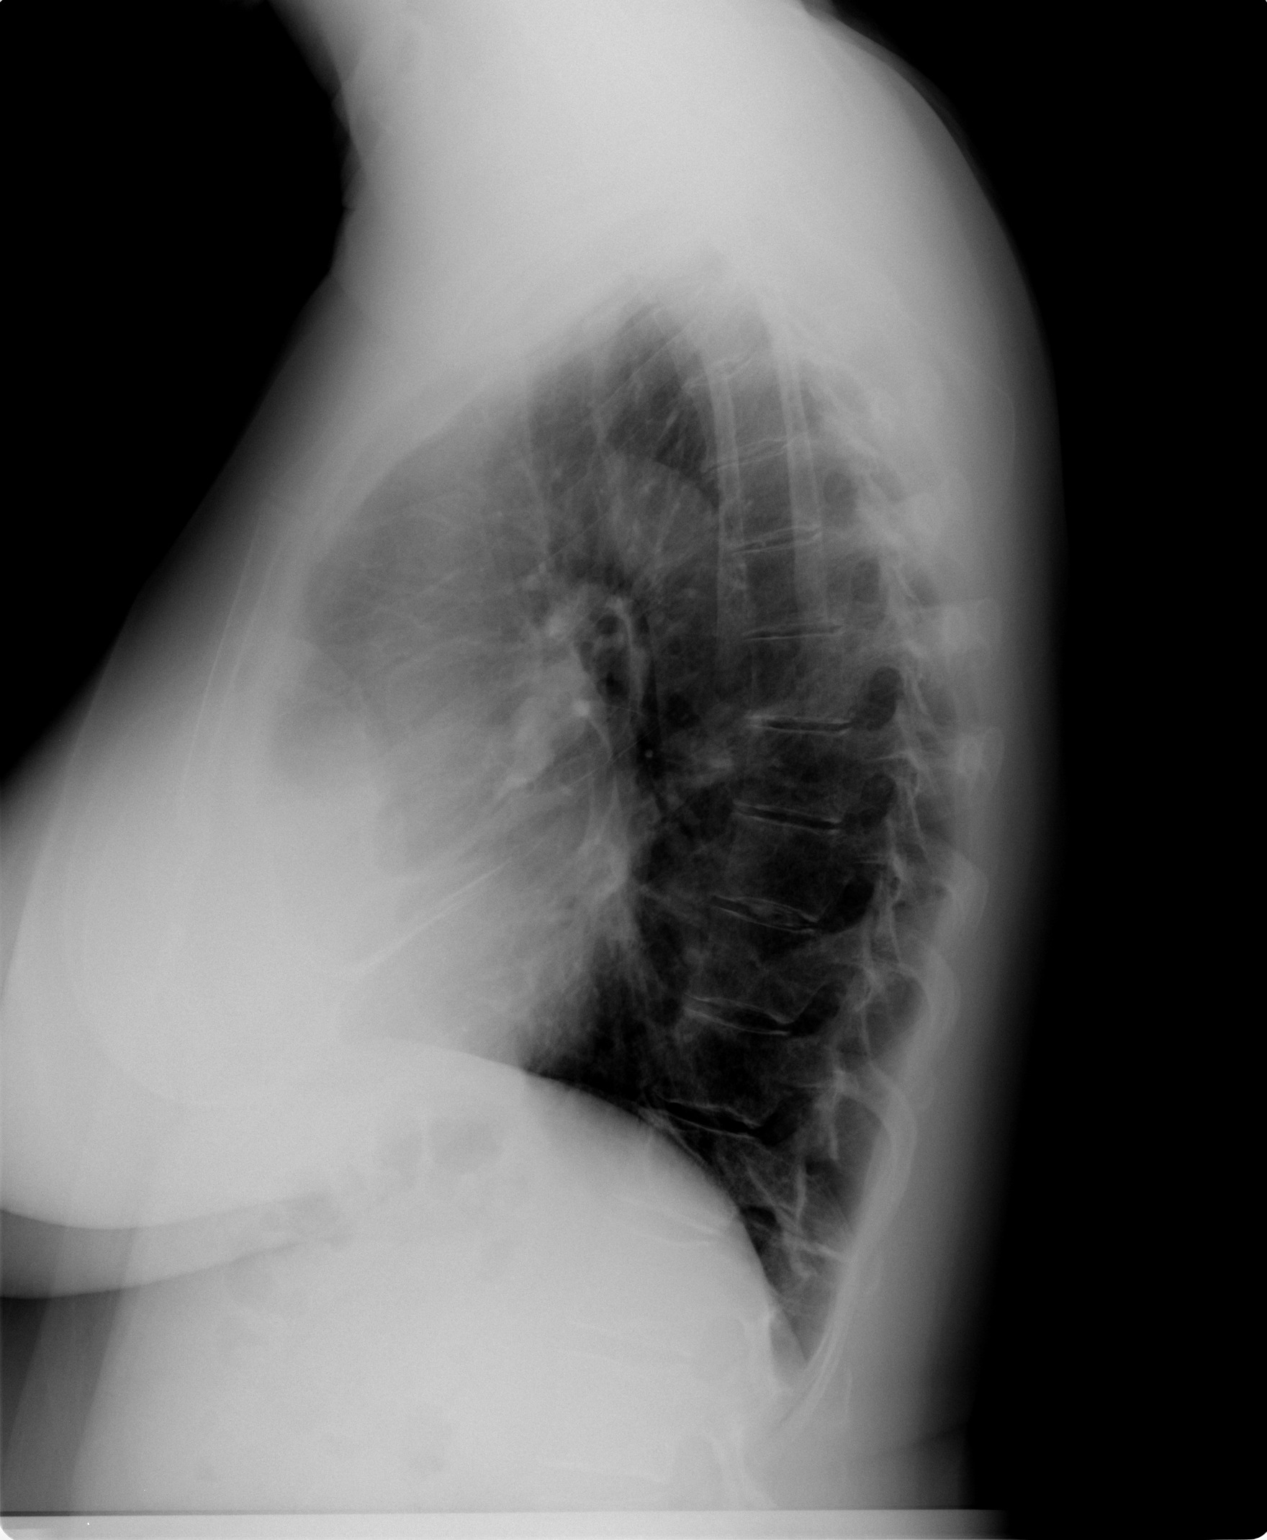

[2 of 2 positions shown; findings below may reference images not displayed]

FINDINGS: Upper normal heart size.
Tortuous aorta.
Pulmonary vascularity normal.
Mass identified on prior CT adjacent to the left heart border is
poorly visualized.
Remaining lungs clear.
Underlying emphysematous changes.
No pleural effusion or pneumothorax.
No acute osseous findings.
IMPRESSION: Mild emphysematous changes.
Left lung mass adjacent to left heart border on prior CT and PET CT
exams is suboptimally visualized radiographically.

## 2013-11-08 IMAGING — CR DG CHEST 1V PORT
1 series · 1 of 1 positions shown · non-contrast
Comparison: Portable exam 5446 hours compared to 03/26/2012

CLINICAL DATA: Left upper lobe mass post VATS

PORTABLE CHEST - 1 VIEW

[AP]
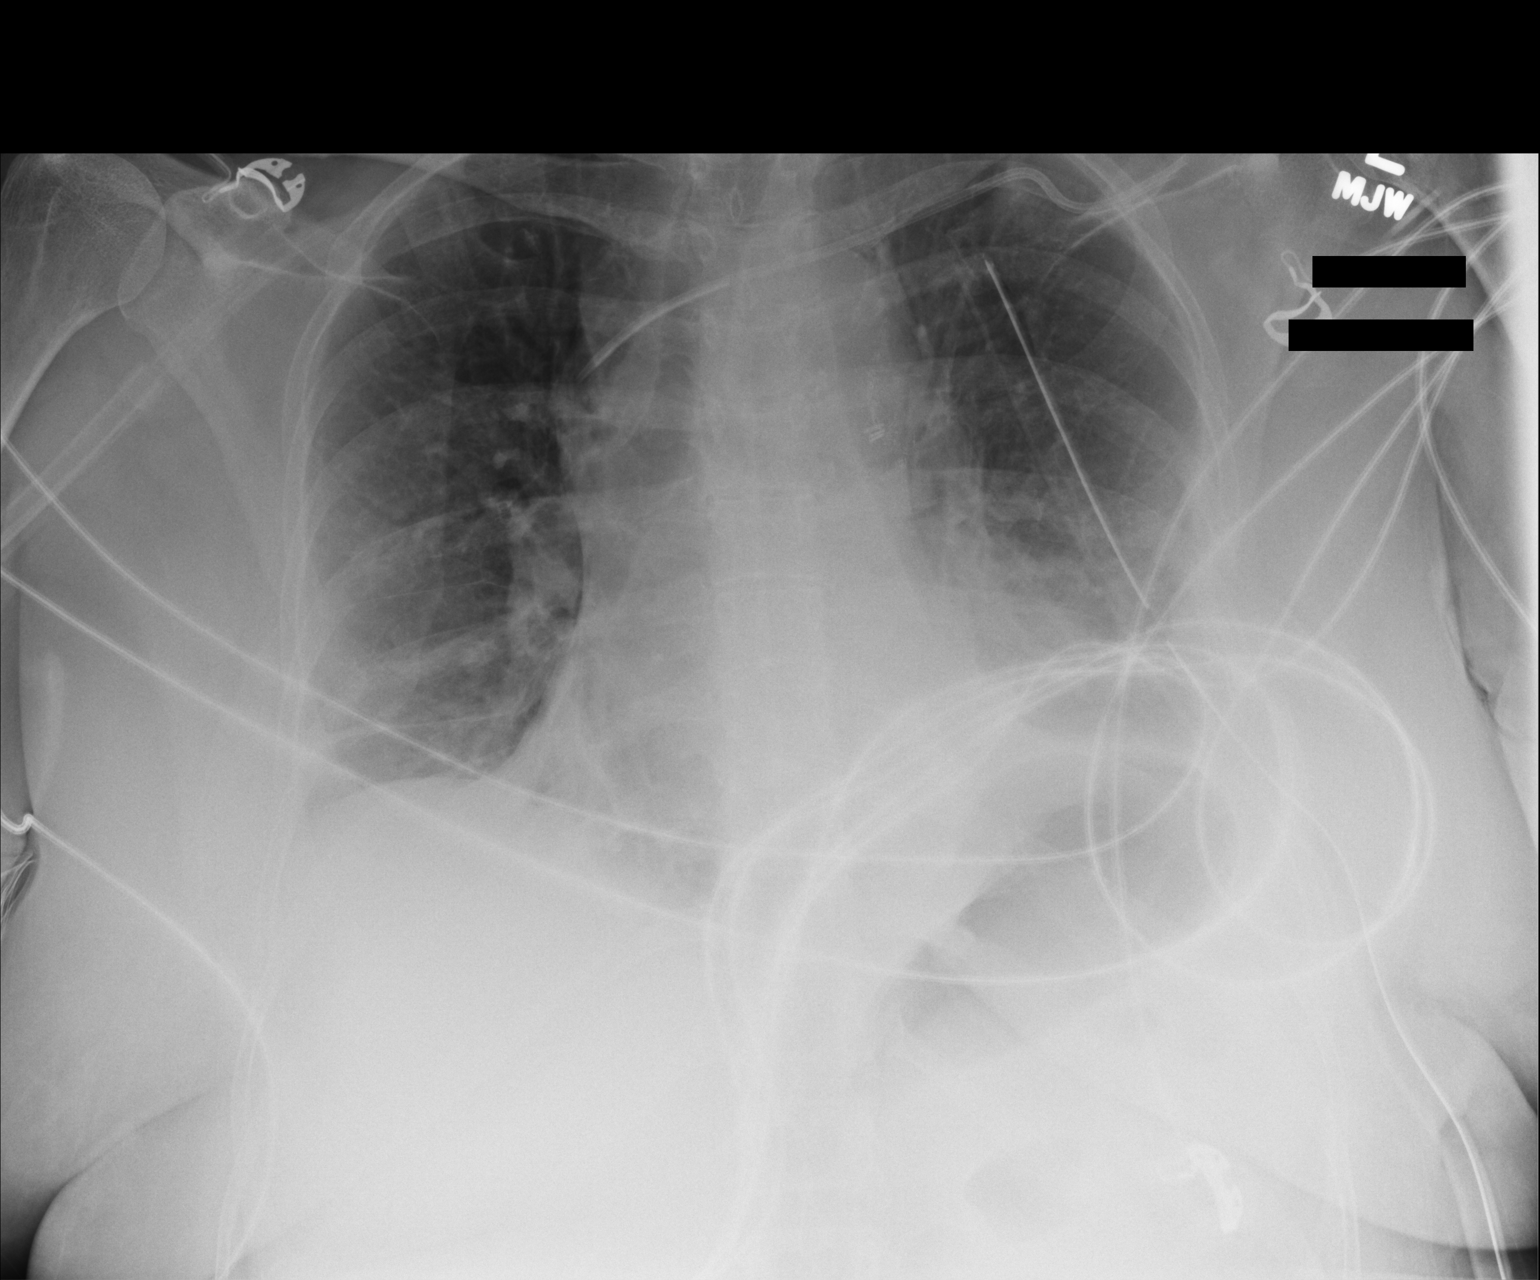

[1 of 1 positions shown; findings below may reference images not displayed]

FINDINGS: Single left thoracostomy tube remains.
Left subclavian and venous catheter tip projecting over SVC near/at
azygos confluence.
Stable heart size and mediastinal contours.
Low lung volumes with bibasilar atelectasis.
Upper lungs clear.
No pneumothorax.
IMPRESSION: Bibasilar atelectasis.

## 2013-11-09 IMAGING — CR DG CHEST 1V PORT
1 series · 1 of 1 positions shown · non-contrast
Comparison: 03/27/2012

CLINICAL DATA: Postop.

PORTABLE CHEST - 1 VIEW

[AP]
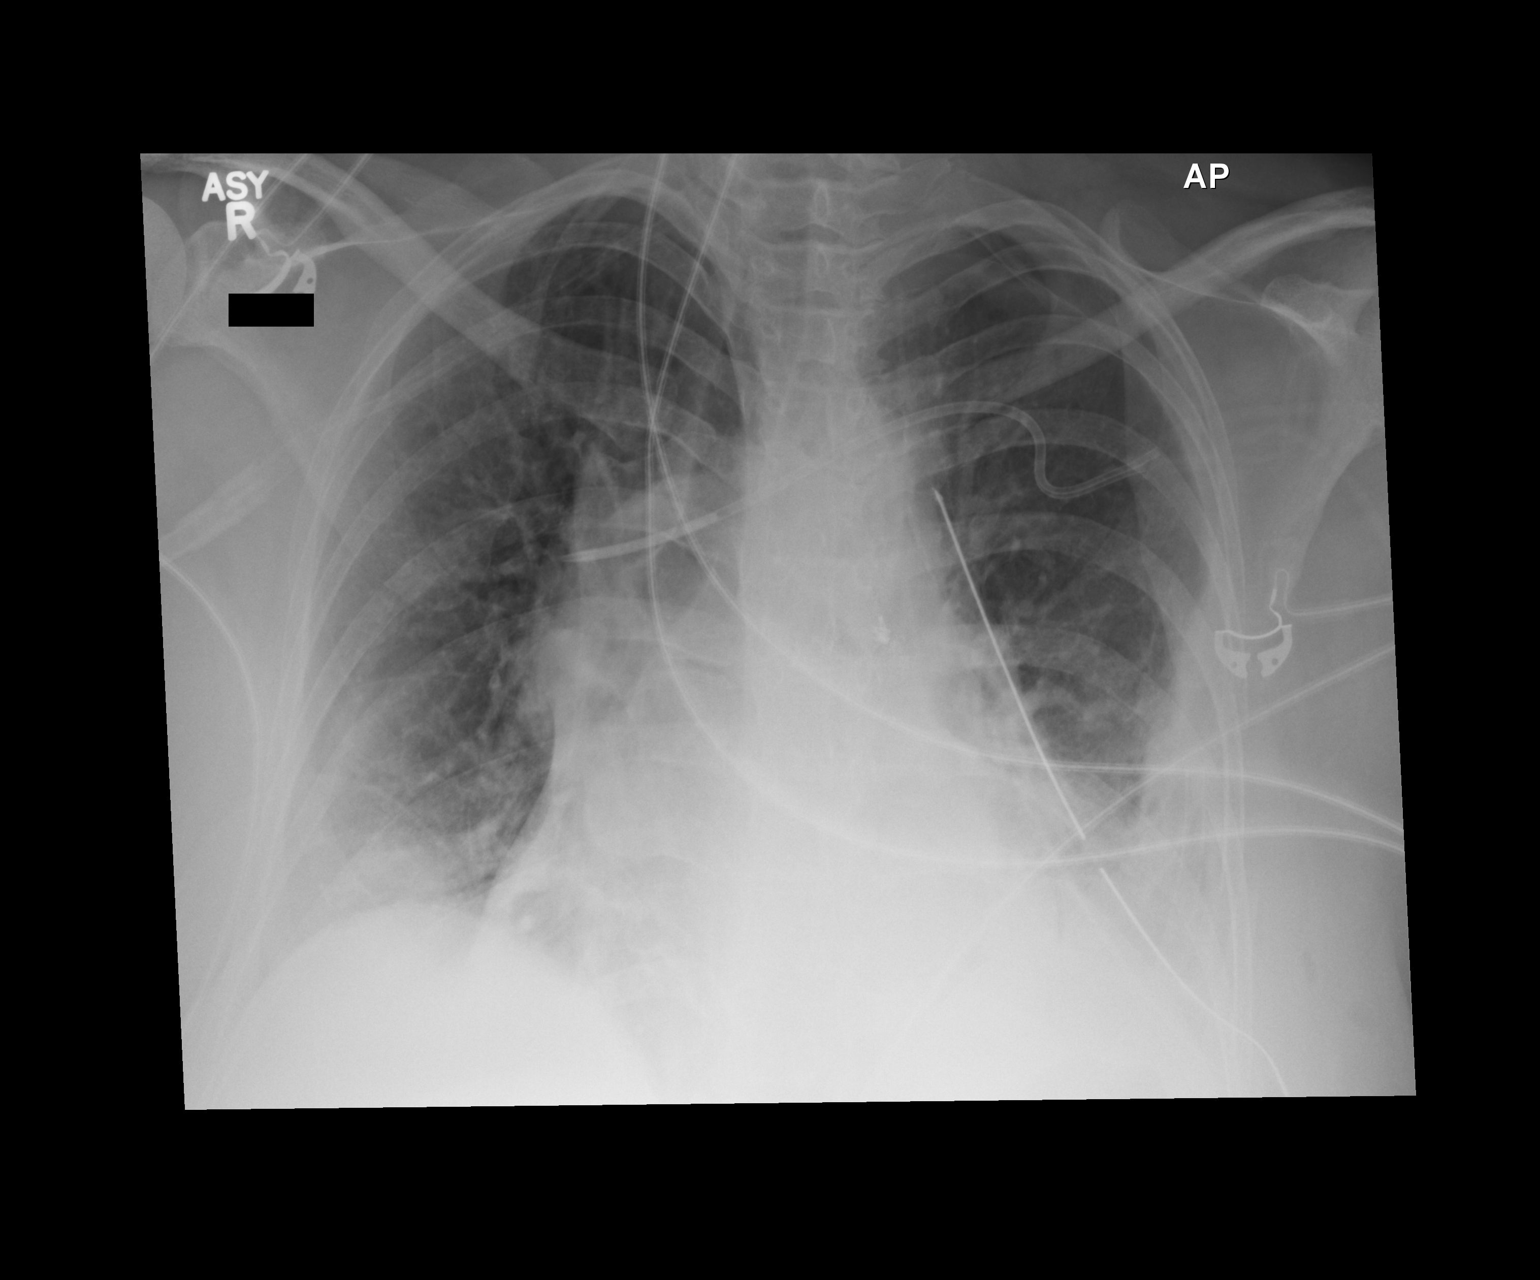

[1 of 1 positions shown; findings below may reference images not displayed]

FINDINGS: Support devices are unchanged.  Cardiomegaly with
vascular congestion.  Bibasilar opacities have worsened since prior
study, likely atelectasis.  Small left pleural effusion.
IMPRESSION: Increasing bibasilar atelectasis.  Stable small left effusion.

## 2013-11-10 IMAGING — CR DG CHEST 1V PORT
1 series · 1 of 1 positions shown · non-contrast
Comparison: 03/28/2012

CLINICAL DATA: Atelectasis and effusion.

PORTABLE CHEST - 1 VIEW

[AP]
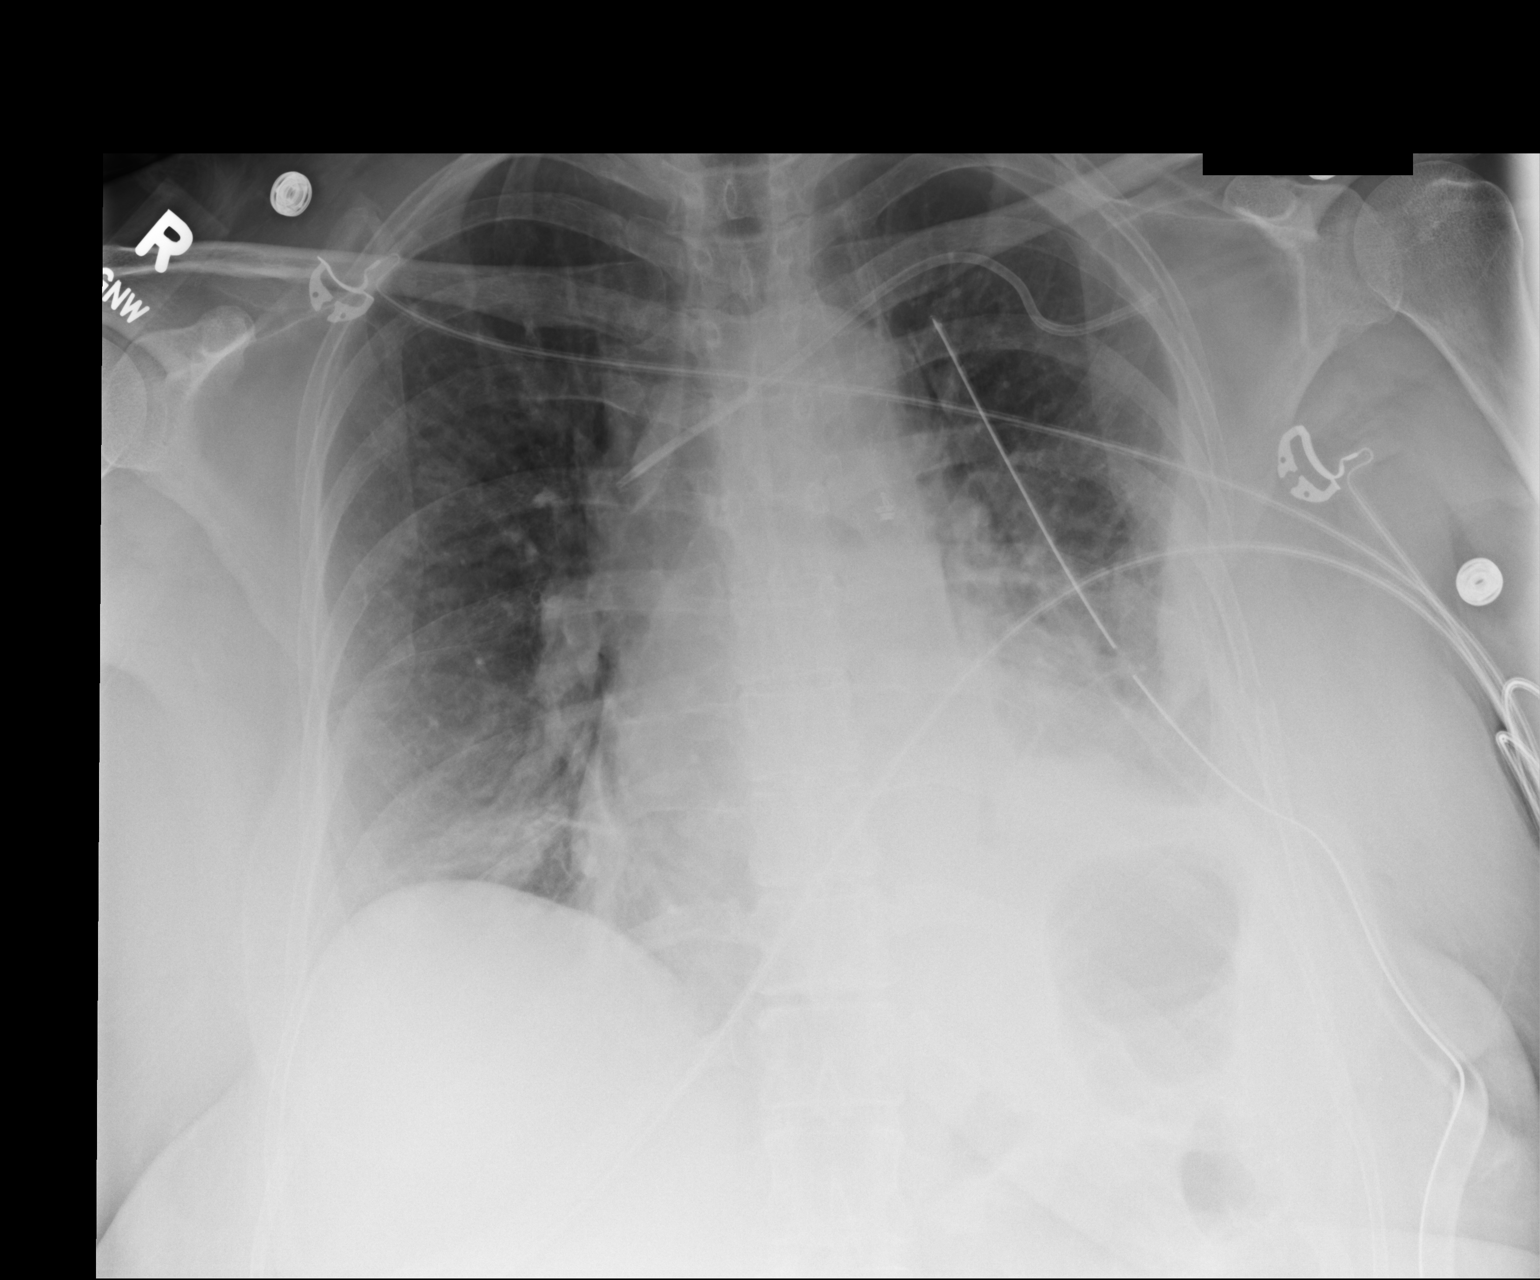

[1 of 1 positions shown; findings below may reference images not displayed]

FINDINGS: Stable position of the left chest tube.  There are stable
pleural and parenchymal densities at the left lung base. There is a
small left apical pneumothorax.  The patient has displaced left rib
fractures.  There are a few densities at the right lung base that
are suggestive for atelectasis.  The heart and mediastinum are
grossly stable.  Left subclavian central line in the upper SVC
region and pointing towards the right side of the chest.
IMPRESSION: A small left apical pneumothorax.  The chest tube is still present.

Persistent basilar densities, left side greater than right.

Multiple left rib fractures.

## 2014-04-22 ENCOUNTER — Encounter (HOSPITAL_COMMUNITY): Payer: Self-pay | Admitting: Cardiology

## 2015-11-09 ENCOUNTER — Other Ambulatory Visit (HOSPITAL_COMMUNITY): Payer: Self-pay | Admitting: Orthopedic Surgery

## 2015-11-09 DIAGNOSIS — M7989 Other specified soft tissue disorders: Secondary | ICD-10-CM

## 2015-11-09 DIAGNOSIS — M79604 Pain in right leg: Secondary | ICD-10-CM

## 2015-11-10 ENCOUNTER — Ambulatory Visit (HOSPITAL_COMMUNITY)
Admission: RE | Admit: 2015-11-10 | Discharge: 2015-11-10 | Disposition: A | Discharge status: Home / Self Care | Payer: Worker's Compensation | Source: Ambulatory Visit | Attending: Cardiology | Admitting: Cardiology

## 2015-11-10 DIAGNOSIS — I10 Essential (primary) hypertension: Secondary | ICD-10-CM | POA: Diagnosis not present

## 2015-11-10 DIAGNOSIS — F419 Anxiety disorder, unspecified: Secondary | ICD-10-CM | POA: Diagnosis not present

## 2015-11-10 DIAGNOSIS — K219 Gastro-esophageal reflux disease without esophagitis: Secondary | ICD-10-CM | POA: Insufficient documentation

## 2015-11-10 DIAGNOSIS — G4700 Insomnia, unspecified: Secondary | ICD-10-CM | POA: Diagnosis not present

## 2015-11-10 DIAGNOSIS — M79604 Pain in right leg: Secondary | ICD-10-CM | POA: Insufficient documentation

## 2015-11-10 DIAGNOSIS — M7989 Other specified soft tissue disorders: Secondary | ICD-10-CM

## 2018-11-12 ENCOUNTER — Other Ambulatory Visit: Payer: Self-pay | Admitting: Pediatric Intensive Care

## 2018-11-12 DIAGNOSIS — Z20822 Contact with and (suspected) exposure to covid-19: Secondary | ICD-10-CM

## 2018-11-18 LAB — NOVEL CORONAVIRUS, NAA: SARS-CoV-2, NAA: NOT DETECTED

## 2018-11-24 ENCOUNTER — Telehealth: Payer: Self-pay | Admitting: Family Medicine

## 2018-11-24 NOTE — Telephone Encounter (Signed)
Pt given Covid-19 result(not detected) / Pt verbalized understanding

## 2022-03-28 ENCOUNTER — Ambulatory Visit: Payer: Self-pay | Admitting: Physician Assistant
